# Patient Record
Sex: Female | Born: 1985 | Hispanic: No | Marital: Married | Smoking: Former smoker
Health system: Southern US, Community
[De-identification: ages and names within clinical notes are randomized; demographics above are authoritative.]

## PROBLEM LIST (undated history)

## (undated) DIAGNOSIS — R42 Dizziness and giddiness: Secondary | ICD-10-CM

## (undated) DIAGNOSIS — R102 Pelvic and perineal pain: Secondary | ICD-10-CM

## (undated) DIAGNOSIS — R635 Abnormal weight gain: Secondary | ICD-10-CM

## (undated) DIAGNOSIS — H9209 Otalgia, unspecified ear: Secondary | ICD-10-CM

## (undated) DIAGNOSIS — R109 Unspecified abdominal pain: Secondary | ICD-10-CM

## (undated) HISTORY — DX: Pelvic and perineal pain: R10.2

## (undated) HISTORY — DX: Morbid (severe) obesity due to excess calories: E66.01

## (undated) HISTORY — DX: Abnormal weight gain: R63.5

## (undated) HISTORY — DX: Otalgia, unspecified ear: H92.09

## (undated) HISTORY — PX: KNEE SURGERY: SHX244

## (undated) HISTORY — PX: TONSILLECTOMY AND ADENOIDECTOMY: SHX28

## (undated) HISTORY — DX: Dizziness and giddiness: R42

## (undated) HISTORY — DX: Unspecified abdominal pain: R10.9

---

## 2007-09-15 HISTORY — PX: DILATION AND CURETTAGE OF UTERUS: SHX78

## 2008-09-14 HISTORY — PX: KNEE SURGERY: SHX244

## 2015-06-05 ENCOUNTER — Ambulatory Visit (INDEPENDENT_AMBULATORY_CARE_PROVIDER_SITE_OTHER): Payer: Medicaid Other | Admitting: Family Medicine

## 2015-06-05 ENCOUNTER — Encounter: Payer: Self-pay | Admitting: Family Medicine

## 2015-06-05 VITALS — BP 94/53 | HR 56 | Temp 97.8°F | Ht 62.5 in | Wt 280.4 lb

## 2015-06-05 DIAGNOSIS — H9202 Otalgia, left ear: Secondary | ICD-10-CM | POA: Diagnosis not present

## 2015-06-05 DIAGNOSIS — R109 Unspecified abdominal pain: Secondary | ICD-10-CM | POA: Insufficient documentation

## 2015-06-05 DIAGNOSIS — H9209 Otalgia, unspecified ear: Secondary | ICD-10-CM

## 2015-06-05 DIAGNOSIS — Z008 Encounter for other general examination: Secondary | ICD-10-CM | POA: Diagnosis not present

## 2015-06-05 DIAGNOSIS — R42 Dizziness and giddiness: Secondary | ICD-10-CM

## 2015-06-05 DIAGNOSIS — R635 Abnormal weight gain: Secondary | ICD-10-CM

## 2015-06-05 DIAGNOSIS — Z0289 Encounter for other administrative examinations: Secondary | ICD-10-CM

## 2015-06-05 HISTORY — DX: Unspecified abdominal pain: R10.9

## 2015-06-05 HISTORY — DX: Abnormal weight gain: R63.5

## 2015-06-05 HISTORY — DX: Dizziness and giddiness: R42

## 2015-06-05 HISTORY — DX: Otalgia, unspecified ear: H92.09

## 2015-06-05 MED ORDER — CIPROFLOXACIN-DEXAMETHASONE 0.3-0.1 % OT SUSP: 4.0000 [drp] | Freq: Two times a day (BID) | OTIC | Status: DC

## 2015-06-05 NOTE — Progress Notes (Signed)
Tahani utilized during today's visit.  [facility] Visit  HPI: Patient presents to [facility] today for a new patient appointment to establish general primary care, also to discuss dizziness.   DIZZINESS Feeling dizzy for 3-4 days. Dizziness is when she lies down on the bed  Feels like room spins: yes Lightheadedness when stands: yes Palpitations or heart racing: no Prior dizziness: every 5-6 months she has a middle ear infection and experiences these symptoms  Medications tried: given ABX for ear infection. Reports taking medication for dizziness before but doesn't recall what it is  Taking blood thinners: reported when she was pregnant   Symptoms Hearing Loss: no Ear Pain or fullness: pain in left ear. Her "ear infections" started about 6-7 years ago and has it 2-3 times per year  Nausea or vomiting: no Vision difficulty or double vision: no Falls: no Head trauma: no Weakness in arm or leg: no Speaking problems: no Headache: no  Weight gain:  She had a left knee surgery about 6 years ago.  Developed some leg numbness after the surgery  She was told to take cortisone and referred to PT  She had been taking cortisone everyday until 1 year ago she stopped the medications herself.  Her previous heaviest weight was 65 kg.  She would eat all of the time while she was taking the cortisone.   Abdominal cramping/fertility concerns:  She has lower abdominal pain when she is on her cycle. She would like to be referred to an OB for her fertility concerns.    ROS: otherwise see HPI  Past Medical Hx:  -none   Past Surgical Hx:  -tonsillectomy  - adenectomy  - c section  -  Left knee procedure: vertical scar over patella   Family Hx: updated in Epic - Number of family members:  11 mother/father/brothers/sisters  - Number of family members in [place]:  None. They currently live in [place]   Immigrant Social History: - Name spelling correct?: yes - Date arrived in [place]:  April 12, 2015  - Country of origin: [place]  - Location of refugee camp (if applicable), how long there, and what caused patient to leave home country?: no, the current war  - Primary language: arabic   -Requires intepreter (essentially speaks no [place]) - Education: Highest level of education: [employer] she didn't pass her last test of test  - Prior work: Investment banker, operational, Patent attorney  - Best family contact/phone number [phone] - Tobacco/alcohol/drug use: 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  n/a  - if yes:  Are you having bad dreams about your experience?     Do you feel "jumpy" or "nervous?"     Do you feel that the experience is happening again?     Are you "super alert" or watchful?   Preventative Care History: -Seen at health department?: no  PHYSICAL EXAM: BP 94/53 mmHg  Pulse 56  Temp(Src) 97.8 F (36.6 C) (Oral)  Ht 5' 2.5" (1.588 m)  Wt 280 lb 6.4 oz (127.189 kg)  BMI 50.44 kg/m2  LMP 06/03/2015 Gen: well appearing, NAD  HEENT: TM's clear and intact,some pain with palpation of the tragus and the left side, no erythema in the ear canal, no cervical lymphadenopathy, clear conjunctiva, discoloration noticed on bottom teeth, Neck:  supple Heart: bradycardic regular rhythm, no murmur, rubs or gallops Lungs: clear to auscultation bilaterally, no extra effort, Abdomen: soft, nontender nondistended, positive bowel sounds, no hepatosplenomegaly Skin:  Vertical scar that is healed on her left patella, no rashes MSK: 5 out of 5 strength in upper and lower extremities, Neuro: cranial nerves II-12 intact, normal gait Psych: alert and oriented  Orthostatic vital signs  Lying 110/60 61 Sitting 107/66 63 Standing 106/37 76 taken immediately upon standing Standing 93/56 72 three minutes of standing    Examined and interviewed with Dr. Gwendolyn Grant  Dizziness Reports that her dizziness is associated with ear infections but today exam looks normal. Some pain to palpation of the tragus on the left side Orthostatics positive today. This could be related to her chronic steroid use.  Also possible for vertigo as having some ear pain  - lab testing for cortisol  - ciprodex for ear pain  - pending lab work, will determine treatment plan  - if no improvement may need to consider meclizine vs vestibular PT vs CT of head/neck vs referral to ENT    Weight gain Would suspect some weight gain after having knee surgery  Reports being placed on steroid for several years after surgery  She stopped this herself and has not had f/u. This may be contributing to her dizziness symptoms.  - am cortisol, BMP, TSH, CBC   Ear pain Occurring in left ear  Possible for some otitis externa as pain with palpation on tragus, posterior auricle Membrane is intact with no fluid  Uvula midline with no tonsillar swelling or abscess formation  - ciprodex drops   Abdominal cramping Has cramping related to her cycle and concerns with fertility  Denies any changes in BM  Would prefer a female provider as per her religious customs.  - referral to OB/GYN

## 2015-06-05 NOTE — Assessment & Plan Note (Signed)
Occurring in left ear  Possible for some otitis externa as pain with palpation on tragus, posterior auricle Membrane is intact with no fluid  Uvula midline with no tonsillar swelling or abscess formation  - ciprodex drops

## 2015-06-05 NOTE — Assessment & Plan Note (Addendum)
Would suspect some weight gain after having knee surgery  Reports being placed on steroid for several years after surgery  She stopped this herself and has not had f/u. This may be contributing to her dizziness symptoms.  - am cortisol, BMP, TSH, CBC

## 2015-06-05 NOTE — Assessment & Plan Note (Addendum)
Reports that her dizziness is associated with ear infections but today exam looks normal. Some pain to palpation of the tragus on the left side Orthostatics positive today. This could be related to her chronic steroid use.  Also possible for vertigo as having some ear pain  - lab testing for cortisol  - ciprodex for ear pain  - pending lab work, will determine treatment plan  - if no improvement may need to consider meclizine vs vestibular PT vs CT of head/neck vs referral to ENT

## 2015-06-05 NOTE — Patient Instructions (Signed)
Use the ear drops for the next week.   Come back in the morning for a lab appointment.

## 2015-06-05 NOTE — Assessment & Plan Note (Addendum)
Has cramping related to her cycle and concerns with fertility  Denies any changes in BM  Would prefer a female provider as per her religious customs.  - referral to OB/GYN

## 2015-06-10 ENCOUNTER — Other Ambulatory Visit (INDEPENDENT_AMBULATORY_CARE_PROVIDER_SITE_OTHER): Payer: Medicaid Other

## 2015-06-10 DIAGNOSIS — R42 Dizziness and giddiness: Secondary | ICD-10-CM

## 2015-06-10 DIAGNOSIS — Z0289 Encounter for other administrative examinations: Secondary | ICD-10-CM

## 2015-06-10 LAB — CBC WITH DIFFERENTIAL/PLATELET
Basophils Absolute: 0 10*3/uL (ref 0.0–0.1)
Basophils Relative: 0 % (ref 0–1)
Eosinophils Absolute: 0.1 10*3/uL (ref 0.0–0.7)
Eosinophils Relative: 1 % (ref 0–5)
HEMATOCRIT: 37.3 % (ref 36.0–46.0)
HEMOGLOBIN: 11.9 g/dL — AB (ref 12.0–15.0)
LYMPHS PCT: 30 % (ref 12–46)
Lymphs Abs: 2.6 10*3/uL (ref 0.7–4.0)
MCH: 26.6 pg (ref 26.0–34.0)
MCHC: 31.9 g/dL (ref 30.0–36.0)
MCV: 83.4 fL (ref 78.0–100.0)
MONO ABS: 0.5 10*3/uL (ref 0.1–1.0)
MONOS PCT: 6 % (ref 3–12)
MPV: 10.5 fL (ref 8.6–12.4)
NEUTROS ABS: 5.4 10*3/uL (ref 1.7–7.7)
Neutrophils Relative %: 63 % (ref 43–77)
Platelets: 280 10*3/uL (ref 150–400)
RBC: 4.47 MIL/uL (ref 3.87–5.11)
RDW: 14.7 % (ref 11.5–15.5)
WBC: 8.5 10*3/uL (ref 4.0–10.5)

## 2015-06-10 NOTE — Progress Notes (Signed)
Labs done today marci holder 

## 2015-06-11 ENCOUNTER — Telehealth: Payer: Self-pay | Admitting: Family Medicine

## 2015-06-11 ENCOUNTER — Ambulatory Visit (INDEPENDENT_AMBULATORY_CARE_PROVIDER_SITE_OTHER): Payer: Medicaid Other | Admitting: Family Medicine

## 2015-06-11 ENCOUNTER — Encounter: Payer: Self-pay | Admitting: Family Medicine

## 2015-06-11 VITALS — BP 119/67 | HR 64 | Temp 97.5°F | Wt 280.0 lb

## 2015-06-11 DIAGNOSIS — E2740 Unspecified adrenocortical insufficiency: Secondary | ICD-10-CM | POA: Diagnosis not present

## 2015-06-11 DIAGNOSIS — R42 Dizziness and giddiness: Secondary | ICD-10-CM

## 2015-06-11 DIAGNOSIS — T380X5A Adverse effect of glucocorticoids and synthetic analogues, initial encounter: Secondary | ICD-10-CM

## 2015-06-11 LAB — COMPREHENSIVE METABOLIC PANEL
ALK PHOS: 45 U/L (ref 33–115)
ALT: 18 U/L (ref 6–29)
AST: 14 U/L (ref 10–30)
Albumin: 4.1 g/dL (ref 3.6–5.1)
BUN: 7 mg/dL (ref 7–25)
CHLORIDE: 101 mmol/L (ref 98–110)
CO2: 30 mmol/L (ref 20–31)
Calcium: 9.4 mg/dL (ref 8.6–10.2)
Creat: 0.54 mg/dL (ref 0.50–1.10)
GLUCOSE: 90 mg/dL (ref 65–99)
POTASSIUM: 4.2 mmol/L (ref 3.5–5.3)
Sodium: 139 mmol/L (ref 135–146)
Total Bilirubin: 0.4 mg/dL (ref 0.2–1.2)
Total Protein: 7.5 g/dL (ref 6.1–8.1)

## 2015-06-11 LAB — TSH: TSH: 1.34 u[IU]/mL (ref 0.350–4.500)

## 2015-06-11 LAB — HIV ANTIBODY (ROUTINE TESTING W REFLEX): HIV 1&2 Ab, 4th Generation: NONREACTIVE

## 2015-06-11 LAB — CORTISOL-AM, BLOOD: CORTISOL - AM: 5.9 ug/dL (ref 4.3–22.4)

## 2015-06-11 LAB — RPR

## 2015-06-11 MED ORDER — MECLIZINE HCL 25 MG PO TABS: 12.5000 mg | ORAL_TABLET | Freq: Three times a day (TID) | ORAL | Status: DC | PRN

## 2015-06-11 NOTE — Progress Notes (Signed)
Subjective: CC: dizzy/ off balance HPI: Patient is a 29 y.o. female presenting to clinic today for same day appointment. Concerns today include:  Arabic interpretation provided by [facility] Interpreter ID [phone]  Dizziness/ weakness Patient reports that she was seen last week for dizziness. She was seen in clinic last week and was given ear drops to help with her ears.  She reports that she took oral steroids for several years and stopped them suddenly 1-1.5 years ago.  She reports that dizziness, weakness, off balance symptoms developed about 1-2 weeks ago.  She reports that she has had this before, occuring every 4-5 months.  She reports that episodes last 1 week when she is on medication in [place].  She believes it may be an antibiotic.  She reports that dizziness is present when she is laying down flat.  She also notes the rooms spinning and feeling light headedness when she is active doing chores.  Denies fevers, chills, nausea, rhinorrhea, congestion, vision changes.  Endorses occasional b/l inner ear pain.     Social History Reviewed: non smoker. FamHx and MedHx reviewed.  Please see EMR. Health Maintenance: Flu shot and pap smear due  ROS: All other systems reviewed and are negative.  Objective: Office vital signs reviewed. BP 119/67 mmHg  Pulse 64  Temp(Src) 97.5 F (36.4 C) (Oral)  Wt 280 lb (127.007 kg)  LMP 06/03/2015  Physical Examination:  General: Awake, alert, obese, NAD HEENT: Normal    Neck: No masses palpated. No LAD    Ears: TMs intact, normal light reflex, no erythema, no bulging    Eyes: PERRLA, EOMI    Nose: nasal turbinates moist    Throat: MMM, no erythema Cardio: RRR, S1S2 heard, no murmurs appreciated Pulm: CTAB, no wheezes, rhonchi or rales, normal WOB Extremities: WWP, No edema, cyanosis or clubbing; +2 pulses bilaterally MSK: Normal gait and station, ambulates independently Skin: dry, intact, no rashes or lesions Neuro: Strength and sensation  grossly intact, no focal neurologic deficits, speech normal, follows commands, CN 2-12 grossly in tact  Results for orders placed or performed in visit on 06/10/15 (from the past 72 hour(s))  Cortisol-am, blood     Status: None   Collection Time: 06/10/15  8:56 AM  Result Value Ref Range   Cortisol - AM 5.9 4.3 - 22.4 ug/dL  Comprehensive metabolic panel     Status: None   Collection Time: 06/10/15  8:56 AM  Result Value Ref Range   Sodium 139 135 - 146 mmol/L   Potassium 4.2 3.5 - 5.3 mmol/L   Chloride 101 98 - 110 mmol/L   CO2 30 20 - 31 mmol/L   Glucose, Bld 90 65 - 99 mg/dL   BUN 7 7 - 25 mg/dL   Creat 0.54 0.50 - 1.10 mg/dL   Total Bilirubin 0.4 0.2 - 1.2 mg/dL   Alkaline Phosphatase 45 33 - 115 U/L   AST 14 10 - 30 U/L   ALT 18 6 - 29 U/L   Total Protein 7.5 6.1 - 8.1 g/dL   Albumin 4.1 3.6 - 5.1 g/dL   Calcium 9.4 8.6 - 10.2 mg/dL  CBC with Differential/Platelet     Status: Abnormal   Collection Time: 06/10/15  8:56 AM  Result Value Ref Range   WBC 8.5 4.0 - 10.5 K/uL   RBC 4.47 3.87 - 5.11 MIL/uL   Hemoglobin 11.9 (L) 12.0 - 15.0 g/dL   HCT 37.3 36.0 - 46.0 %   MCV 83.4  78.0 - 100.0 fL   MCH 26.6 26.0 - 34.0 pg   MCHC 31.9 30.0 - 36.0 g/dL   RDW 14.7 11.5 - 15.5 %   Platelets 280 150 - 400 K/uL   MPV 10.5 8.6 - 12.4 fL   Neutrophils Relative % 63 43 - 77 %   Neutro Abs 5.4 1.7 - 7.7 K/uL   Lymphocytes Relative 30 12 - 46 %   Lymphs Abs 2.6 0.7 - 4.0 K/uL   Monocytes Relative 6 3 - 12 %   Monocytes Absolute 0.5 0.1 - 1.0 K/uL   Eosinophils Relative 1 0 - 5 %   Eosinophils Absolute 0.1 0.0 - 0.7 K/uL   Basophils Relative 0 0 - 1 %   Basophils Absolute 0.0 0.0 - 0.1 K/uL   Smear Review Criteria for review not met   TSH     Status: None   Collection Time: 06/10/15  8:56 AM  Result Value Ref Range   TSH 1.340 0.350 - 4.500 uIU/mL  HIV antibody     Status: None   Collection Time: 06/10/15  8:56 AM  Result Value Ref Range   HIV 1&2 Ab, 4th Generation  NONREACTIVE NONREACTIVE    Comment:   HIV-1 antigen and HIV-1/HIV-2 antibodies were not detected.  There is no laboratory evidence of HIV infection.   HIV-1/2 Antibody Diff        Not indicated. HIV-1 RNA, Qual TMA          Not indicated.     PLEASE NOTE: This information has been disclosed to you from records whose confidentiality may be protected by state law. If your state requires such protection, then the state law prohibits you from making any further disclosure of the information without the specific written consent of the person to whom it pertains, or as otherwise permitted by law. A general authorization for the release of medical or other information is NOT sufficient for this purpose.   The performance of this assay has not been clinically validated in patients less than 7 years old.   For additional information please refer to http://education.questdiagnostics.com/faq/FAQ106.  (This link is being provided for informational/educational purposes only.)     RPR     Status: None   Collection Time: 06/10/15  8:56 AM  Result Value Ref Range   RPR Ser Ql NON REAC NON REAC   Assessment/ Plan: 28 y.o. 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  AM Cortisol 5.9 (relatively low normal) - Ambulatory referral to Endocrinology  2. Dizziness. No focal neurologic deficits.  Suspect likely related to cortisol level.  However patient w/ long h/o inner ear problems per report.  Therefore, cannot r/o inner ear pathology.  ENT exam grossly normal. - Ambulatory referral to Endocrinology - Ambulatory referral to ENT - meclizine (ANTIVERT) 25 MG tablet; Take 0.5-1 tablets (12.5-25 mg total) by mouth 3 (three) times daily as needed for dizziness.  Dispense: 30 tablet; Refill: 0 - Return precautions reviewed with patient - Follow up in 1 month or sooner if needed  Total time spent with patient 30 minutes.  Greater than 50% of  encounter spent in coordination of care/counseling.  Janora Norlander, DO PGY-2, [facility]

## 2015-06-11 NOTE — Telephone Encounter (Signed)
Hlat Lo from the health dept called and would like a copy of the patient office visit and also the labs from 06/05/15 faxed to her at [phone]. jw

## 2015-06-11 NOTE — Patient Instructions (Addendum)
It was a pleasure seeing you today.  Information regarding what we discussed is included in this packet.  Please make an appointment to see your PCP in 1 month.  I have placed a referral to an endocrinologist for you to evaluate your cortisol levels further.  I have also placed a referral to an Ear Nose and Throat doctor to further evaluate your inner ear.  Please feel free to call our office at [phone] if any questions or concerns arise.  Warm Regards, Ashly M. Gottschalk, DO Vertigo Vertigo means you feel like you or your surroundings are moving when they are not. Vertigo can be dangerous if it occurs when you are at work, driving, or performing difficult activities.  CAUSES  Vertigo occurs when there is a conflict of signals sent to your brain from the visual and sensory systems in your body. There are many different causes of vertigo, including:  Infections, especially in the inner ear.  A bad reaction to a drug or misuse of alcohol and medicines.  Withdrawal from drugs or alcohol.  Rapidly changing positions, such as lying down or rolling over in bed.  A migraine headache.  Decreased blood flow to the brain.  Increased pressure in the brain from a head injury, infection, tumor, or bleeding. SYMPTOMS  You may feel as though the world is spinning around or you are falling to the ground. Because your balance is upset, vertigo can cause nausea and vomiting. You may have involuntary eye movements (nystagmus). DIAGNOSIS  Vertigo is usually diagnosed by physical exam. If the cause of your vertigo is unknown, your caregiver may perform imaging tests, such as an MRI scan (magnetic resonance imaging). TREATMENT  Most cases of vertigo resolve on their own, without treatment. Depending on the cause, your caregiver may prescribe certain medicines. If your vertigo is related to body position issues, your caregiver may recommend movements or procedures to correct the problem. In rare cases,  if your vertigo is caused by certain inner ear problems, you may need surgery. HOME CARE INSTRUCTIONS   Follow your caregiver's instructions.  Avoid driving.  Avoid operating heavy machinery.  Avoid performing any tasks that would be dangerous to you or others during a vertigo episode.  Tell your caregiver if you notice that certain medicines seem to be causing your vertigo. Some of the medicines used to treat vertigo episodes can actually make them worse in some people. SEEK IMMEDIATE MEDICAL CARE IF:   Your medicines do not relieve your vertigo or are making it worse.  You develop problems with talking, walking, weakness, or using your arms, hands, or legs.  You develop severe headaches.  Your nausea or vomiting continues or gets worse.  You develop visual changes.  A family member notices behavioral changes.  Your condition gets worse. MAKE SURE YOU:  Understand these instructions.  Will watch your condition.  Will get help right away if you are not doing well or get worse. Document Released: 06/10/2005 Document Revised: 11/23/2011 Document Reviewed: 03/19/2011 [facility] Patient Information 2015 [place], [place]. This information is not intended to replace advice given to you by your health care Vincenzo Stave. Make sure you discuss any questions you have with your health care Shilah Hefel.

## 2015-06-17 ENCOUNTER — Telehealth: Payer: Self-pay | Admitting: Family Medicine

## 2015-06-17 NOTE — Telephone Encounter (Signed)
Dr. Daune Perch office called because they are trying to set an appointment with the patient but no one in the household speaks english. She would like to speak to Tia. jw

## 2015-06-24 ENCOUNTER — Other Ambulatory Visit: Payer: Self-pay | Admitting: Family Medicine

## 2015-06-24 DIAGNOSIS — Z3169 Encounter for other general counseling and advice on procreation: Secondary | ICD-10-CM

## 2015-06-24 NOTE — Telephone Encounter (Signed)
Printed and faxed as requested. Darlene Cruz  

## 2015-07-18 ENCOUNTER — Ambulatory Visit: Payer: Medicaid Other | Admitting: Family Medicine

## 2016-03-05 ENCOUNTER — Ambulatory Visit: Payer: Medicaid Other | Admitting: Family Medicine

## 2016-03-09 ENCOUNTER — Ambulatory Visit (INDEPENDENT_AMBULATORY_CARE_PROVIDER_SITE_OTHER): Payer: Medicaid Other | Admitting: Family Medicine

## 2016-03-09 VITALS — BP 121/66 | HR 65 | Ht 62.5 in | Wt 287.8 lb

## 2016-03-09 DIAGNOSIS — R102 Pelvic and perineal pain: Secondary | ICD-10-CM

## 2016-03-09 DIAGNOSIS — R350 Frequency of micturition: Secondary | ICD-10-CM

## 2016-03-09 LAB — POCT URINALYSIS DIPSTICK
Bilirubin, UA: NEGATIVE
GLUCOSE UA: NEGATIVE
Nitrite, UA: NEGATIVE
PROTEIN UA: NEGATIVE
Urobilinogen, UA: 0.2
pH, UA: 5.5

## 2016-03-09 LAB — POCT URINE PREGNANCY: PREG TEST UR: NEGATIVE

## 2016-03-09 NOTE — Patient Instructions (Signed)
Thank you for coming in,   I will call or send a letter with the results from today.    Please bring all of your medications with you to each visit.   Health maintenance items that are due.  Health Maintenance  Topic Date Due  . Tetanus Vaccine  02/12/2005  . Pap Smear  02/13/2007  . Flu Shot  04/14/2016  . HIV Screening  Completed     Sign up for My Chart to have easy access to your labs results, and communication with your Primary care physician   Please feel free to call with any questions or concerns at any time, at [phone]. --Dr. Jordan LikesSchmitz

## 2016-03-09 NOTE — Progress Notes (Signed)
   Subjective:    Darlene Cruz - 30 y.o. female MRN [id number]  Date of birth: 06/16/1986  CC pelvic pain   HPI  Darlene Cruz is here for pelvic pain. Visit was conducted with arabic video interpretor.   She is having pain in the left side of her pelvis.  It has been occurring for the past two months.  It is associated with her menstrual cycle  The pain lasts for an hour  It is painful to sit up.  Walking is hard and has had an increase in the frequency in voiding.  There are no changes in her bowel moments.  Home pregnancy test was negative.  She had similarly symptoms three years ago.  She had a history of c section  Ibuprofen hasn't help  Tried a home remedy tea that didn't help  Pain is severe and 10/10 .  PMH: morbid obesity SH: no tobacco or alcohol  FH: mother with HLD, HTN, DM2  Health Maintenance:  Health Maintenance Due  Topic Date Due  . TETANUS/TDAP  02/12/2005  . PAP SMEAR  02/13/2007    Review of Systems See HPI     Objective:   Physical Exam BP 121/66 mmHg  Pulse 65  Ht 5' 2.5" (1.588 m)  Wt 287 lb 12.8 oz (130.545 kg)  BMI 51.77 kg/m2  LMP 03/01/2016 Gen: NAD, alert, cooperative with exam,  CV: RRR, good S1/S2, no murmur, no edema, capillary refill brisk  Resp: CTABL, no wheezes, non-labored Abd: Soft, ND, BS present, no guarding or organomegaly, no deformity, mild pain over the left ovary  Skin: no rashes, normal turgor      Assessment & Plan:   Pelvic pain in female She has had pelvic pain that could be related to her menstrual cycle vs MSK pain vs ovarian cysts.  She has had a referral to Gyn but they recommended she use NSAIDS and obtain a pelvic and transvaginal US.  - encouraged NSAiDS for pain  - pelvic and transvaginal US placed  - urine pregnancy normal  - UA not suggestive of infection

## 2016-03-11 ENCOUNTER — Encounter: Payer: Self-pay | Admitting: Family Medicine

## 2016-03-12 DIAGNOSIS — R102 Pelvic and perineal pain: Secondary | ICD-10-CM

## 2016-03-12 HISTORY — DX: Pelvic and perineal pain: R10.2

## 2016-03-12 NOTE — Assessment & Plan Note (Signed)
She has had pelvic pain that could be related to her menstrual cycle vs MSK pain vs ovarian cysts.  She has had a referral to Gyn but they recommended she use NSAIDS and obtain a pelvic and transvaginal US.  - encouraged NSAiDS for pain  - pelvic and transvaginal US placed  - urine pregnancy normal  - UA not suggestive of infection

## 2016-03-13 ENCOUNTER — Ambulatory Visit (HOSPITAL_COMMUNITY)
Admission: RE | Admit: 2016-03-13 | Discharge: 2016-03-13 | Disposition: A | Discharge status: Home / Self Care | Payer: Medicaid Other | Source: Ambulatory Visit | Attending: Family Medicine | Admitting: Family Medicine

## 2016-03-13 DIAGNOSIS — R102 Pelvic and perineal pain: Secondary | ICD-10-CM | POA: Diagnosis not present

## 2016-03-18 ENCOUNTER — Telehealth: Payer: Self-pay | Admitting: Family Medicine

## 2016-03-18 DIAGNOSIS — R102 Pelvic and perineal pain: Secondary | ICD-10-CM

## 2016-03-18 NOTE — Telephone Encounter (Addendum)
Pt informed by using pacific interpreters ([place], [phone]). Pt asked if she can get a referral to a GYN.  Please advise. Sunday SpillersSharon T Saunders, CMA

## 2016-03-18 NOTE — Telephone Encounter (Signed)
US is normal. Patient may need referral to Gyn  Myra RudeJeremy E Schmitz, MD PGY-3, [facility] 03/18/2016, 2:36 PM

## 2016-03-19 NOTE — Addendum Note (Signed)
Addended by: Beaulah DinningGAMBINO, Meka Lewan M on: 03/19/2016 05:22 PM   Modules accepted: Orders

## 2016-03-19 NOTE — Telephone Encounter (Signed)
Referral to gyn placed. 

## 2016-04-20 ENCOUNTER — Encounter: Payer: Medicaid Other | Admitting: Obstetrics & Gynecology

## 2016-11-02 ENCOUNTER — Encounter (HOSPITAL_COMMUNITY): Payer: Self-pay | Admitting: Emergency Medicine

## 2016-11-02 ENCOUNTER — Ambulatory Visit (HOSPITAL_COMMUNITY)
Admission: EM | Admit: 2016-11-02 | Discharge: 2016-11-02 | Disposition: A | Discharge status: Home / Self Care | Payer: Medicaid Other | Attending: Internal Medicine | Admitting: Internal Medicine

## 2016-11-02 DIAGNOSIS — L308 Other specified dermatitis: Secondary | ICD-10-CM | POA: Diagnosis not present

## 2016-11-02 DIAGNOSIS — N946 Dysmenorrhea, unspecified: Secondary | ICD-10-CM

## 2016-11-02 LAB — POCT URINALYSIS DIP (DEVICE)
Bilirubin Urine: NEGATIVE
GLUCOSE, UA: NEGATIVE mg/dL
Ketones, ur: NEGATIVE mg/dL
LEUKOCYTES UA: NEGATIVE
NITRITE: NEGATIVE
PROTEIN: NEGATIVE mg/dL
Specific Gravity, Urine: 1.01 (ref 1.005–1.030)
UROBILINOGEN UA: 0.2 mg/dL (ref 0.0–1.0)
pH: 5.5 (ref 5.0–8.0)

## 2016-11-02 LAB — POCT PREGNANCY, URINE: Preg Test, Ur: NEGATIVE

## 2016-11-02 MED ORDER — IBUPROFEN 800 MG PO TABS: ORAL_TABLET | ORAL | 1 refills | Status: DC

## 2016-11-02 MED ORDER — FLUTICASONE PROPIONATE 0.005 % EX OINT: 1.0000 "application " | TOPICAL_OINTMENT | Freq: Two times a day (BID) | CUTANEOUS | 1 refills | Status: DC

## 2016-11-02 MED ORDER — KETOROLAC TROMETHAMINE 60 MG/2ML IM SOLN
INTRAMUSCULAR | Status: AC
  Filled 2016-11-02: qty 2

## 2016-11-02 MED ORDER — KETOROLAC TROMETHAMINE 60 MG/2ML IM SOLN
60.0000 mg | Freq: Once | INTRAMUSCULAR | Status: AC
  Administered 2016-11-02: 60 mg via INTRAMUSCULAR

## 2016-11-02 NOTE — ED Triage Notes (Signed)
[facility] a [facility] abdominal and low back pain x 2 days.

## 2016-11-02 NOTE — ED Provider Notes (Signed)
CSN: [id number]     Arrival date & time 11/02/16  1625 History   First MD Initiated Contact with Patient 11/02/16 1810     Chief Complaint  Patient presents with  . Abdominal Pain  . Back Pain   (Consider location/radiation/quality/duration/timing/severity/associated sxs/prior Treatment) Patient is c/o dysmenorrhea for 2 days.  She has an interpreter.  She c/o very painful menses and back pain.  She c/o rash on right finger webbing.   The history is provided by the patient and the spouse. The history is limited by a language barrier. A language interpreter was used.  Abdominal Pain  Pain location:  Generalized Pain quality: aching   Pain radiates to:  Back Pain severity:  Moderate Onset quality:  Sudden Duration:  2 days Timing:  Constant Progression:  Worsening Chronicity:  New Relieved by:  Nothing Worsened by:  Nothing Ineffective treatments:  None tried Back Pain  Associated symptoms: abdominal pain     History reviewed. No pertinent past medical history. Past Surgical History:  Procedure Laterality Date  . CESAREAN SECTION    . TONSILLECTOMY AND ADENOIDECTOMY     Family History  Problem Relation Age of Onset  . Hypertension Mother   . Diabetes Mother   . Hyperlipidemia Mother    Social History  Substance Use Topics  . Smoking status: Former Smoker    Packs/day: 0.20    Years: 5.00    Types: Cigarettes  . Smokeless tobacco: Not on file     Comment: quit July 2016   . Alcohol use No   OB History    No data available     Review of Systems  Constitutional: Negative.   HENT: Negative.   Eyes: Negative.   Respiratory: Negative.   Cardiovascular: Negative.   Gastrointestinal: Positive for abdominal pain.  Endocrine: Negative.   Genitourinary: Negative.   Musculoskeletal: Positive for back pain.  Allergic/Immunologic: Negative.   Neurological: Negative.   Hematological: Negative.   Psychiatric/Behavioral: Negative.     Allergies  Patient has no  known allergies.  Home Medications   Prior to Admission medications   Medication Sig Start Date End Date Taking? Authorizing Provider  fluticasone (CUTIVATE) 0.005 % ointment Apply 1 application topically 2 (two) times daily. 11/02/16   Deatra Canter, FNP  ibuprofen (ADVIL,MOTRIN) 800 MG tablet Take one po tid x 5 days and start 2 days prior to menses. 11/02/16   Deatra Canter, FNP   Meds Ordered and Administered this Visit   Medications  ketorolac (TORADOL) injection 60 mg (60 mg Intramuscular Given 11/02/16 1844)    BP 106/56 (BP Location: Right Arm)   Pulse (!) 59   Temp 98.2 F (36.8 C) (Oral)   Resp 16   LMP 11/01/2016 (Exact Date)   SpO2 99%  No data found.   Physical Exam  Constitutional: She appears well-developed and well-nourished.  HENT:  Head: Normocephalic and atraumatic.  Eyes: Conjunctivae and EOM are normal. Pupils are equal, round, and reactive to light.  Neck: Normal range of motion. Neck supple.  Cardiovascular: Normal rate, regular rhythm and normal heart sounds.   Pulmonary/Chest: Effort normal and breath sounds normal.  Nursing note and vitals reviewed.   Urgent Care Course     Procedures (including critical care time)  Labs Review Labs Reviewed  POCT URINALYSIS DIP (DEVICE) - Abnormal; Notable for the following:       Result Value   Hgb urine dipstick SMALL (*)    All other components within  normal limits  POCT PREGNANCY, URINE    Imaging Review No results found.   Visual Acuity Review  Right Eye Distance:   Left Eye Distance:   Bilateral Distance:    Right Eye Near:   Left Eye Near:    Bilateral Near:         MDM   1. Dysmenorrhea   2. Other eczema    Ibuprofen 800mg  one po tid x 5 days prn painful menses #30w/1rf Cutivate Cream bid prn rash #30grams  [facility] for [facility].      Deatra CanterWilliam J Amias Hutchinson, FNP 11/02/16 1914

## 2016-11-19 ENCOUNTER — Emergency Department (HOSPITAL_COMMUNITY)
Admission: EM | Admit: 2016-11-19 | Discharge: 2016-11-19 | Disposition: A | Discharge status: Home / Self Care | Payer: Self-pay | Attending: Emergency Medicine | Admitting: Emergency Medicine

## 2016-11-19 ENCOUNTER — Emergency Department (HOSPITAL_COMMUNITY): Payer: Self-pay

## 2016-11-19 ENCOUNTER — Encounter (HOSPITAL_COMMUNITY): Payer: Self-pay

## 2016-11-19 DIAGNOSIS — Z79899 Other long term (current) drug therapy: Secondary | ICD-10-CM | POA: Insufficient documentation

## 2016-11-19 DIAGNOSIS — R109 Unspecified abdominal pain: Secondary | ICD-10-CM

## 2016-11-19 DIAGNOSIS — R1011 Right upper quadrant pain: Secondary | ICD-10-CM | POA: Insufficient documentation

## 2016-11-19 DIAGNOSIS — Z87891 Personal history of nicotine dependence: Secondary | ICD-10-CM | POA: Insufficient documentation

## 2016-11-19 LAB — COMPREHENSIVE METABOLIC PANEL
ALT: 25 U/L (ref 14–54)
ANION GAP: 6 (ref 5–15)
AST: 25 U/L (ref 15–41)
Albumin: 4.4 g/dL (ref 3.5–5.0)
Alkaline Phosphatase: 40 U/L (ref 38–126)
BUN: 9 mg/dL (ref 6–20)
CHLORIDE: 106 mmol/L (ref 101–111)
CO2: 25 mmol/L (ref 22–32)
Calcium: 9.4 mg/dL (ref 8.9–10.3)
Creatinine, Ser: 0.52 mg/dL (ref 0.44–1.00)
Glucose, Bld: 79 mg/dL (ref 65–99)
POTASSIUM: 3.7 mmol/L (ref 3.5–5.1)
SODIUM: 137 mmol/L (ref 135–145)
Total Bilirubin: 0.6 mg/dL (ref 0.3–1.2)
Total Protein: 7.9 g/dL (ref 6.5–8.1)

## 2016-11-19 LAB — URINALYSIS, ROUTINE W REFLEX MICROSCOPIC
BILIRUBIN URINE: NEGATIVE
Glucose, UA: NEGATIVE mg/dL
KETONES UR: 5 mg/dL — AB
LEUKOCYTES UA: NEGATIVE
Nitrite: NEGATIVE
PH: 5 (ref 5.0–8.0)
PROTEIN: NEGATIVE mg/dL
Specific Gravity, Urine: 1.011 (ref 1.005–1.030)

## 2016-11-19 LAB — CBC
HEMATOCRIT: 36.4 % (ref 36.0–46.0)
HEMOGLOBIN: 11.8 g/dL — AB (ref 12.0–15.0)
MCH: 26.6 pg (ref 26.0–34.0)
MCHC: 32.4 g/dL (ref 30.0–36.0)
MCV: 82.2 fL (ref 78.0–100.0)
PLATELETS: 247 10*3/uL (ref 150–400)
RBC: 4.43 MIL/uL (ref 3.87–5.11)
RDW: 14.9 % (ref 11.5–15.5)
WBC: 7.5 10*3/uL (ref 4.0–10.5)

## 2016-11-19 LAB — PREGNANCY, URINE: Preg Test, Ur: NEGATIVE

## 2016-11-19 LAB — LIPASE, BLOOD: LIPASE: 19 U/L (ref 11–51)

## 2016-11-19 MED ORDER — SUCRALFATE 1 GM/10ML PO SUSP: 1.0000 g | Freq: Three times a day (TID) | ORAL | 0 refills | Status: DC

## 2016-11-19 MED ORDER — MORPHINE SULFATE (PF) 4 MG/ML IV SOLN
4.0000 mg | Freq: Once | INTRAVENOUS | Status: AC
  Administered 2016-11-19: 4 mg via INTRAVENOUS
  Filled 2016-11-19: qty 1

## 2016-11-19 MED ORDER — SODIUM CHLORIDE 0.9 % IV BOLUS (SEPSIS)
1000.0000 mL | Freq: Once | INTRAVENOUS | Status: AC
  Administered 2016-11-19: 1000 mL via INTRAVENOUS

## 2016-11-19 MED ORDER — ONDANSETRON HCL 4 MG/2ML IJ SOLN
4.0000 mg | Freq: Once | INTRAMUSCULAR | Status: AC
  Administered 2016-11-19: 4 mg via INTRAVENOUS
  Filled 2016-11-19: qty 2

## 2016-11-19 MED ORDER — ALUM & MAG HYDROXIDE-SIMETH 400-400-40 MG/5ML PO SUSP: 15.0000 mL | ORAL | 0 refills | Status: DC | PRN

## 2016-11-19 MED ORDER — FAMOTIDINE 40 MG PO TABS: 40.0000 mg | ORAL_TABLET | Freq: Every day | ORAL | 0 refills | Status: DC

## 2016-11-19 MED ORDER — IOPAMIDOL (ISOVUE-300) INJECTION 61%
INTRAVENOUS | Status: AC
  Filled 2016-11-19: qty 30

## 2016-11-19 MED ORDER — IOPAMIDOL (ISOVUE-300) INJECTION 61%
30.0000 mL | Freq: Once | INTRAVENOUS | Status: AC | PRN
  Administered 2016-11-19: 30 mL via ORAL

## 2016-11-19 NOTE — ED Notes (Signed)
Called Lab, urine preg is being run at this time.

## 2016-11-19 NOTE — ED Provider Notes (Signed)
[facility]-EMERGENCY DEPT Provider Note   CSN: [id number] Arrival date & time: 11/19/16  1011     History   Chief Complaint Chief Complaint  Patient presents with  . Flank Pain  . Abdominal Pain    HPI Darlene Cruz is a 31 y.o. female.  The history is provided by the patient. A language interpreter was used ([place]).  Abdominal Pain   This is a new problem. Episode onset: 4 days. The problem occurs constantly. The problem has not changed since onset.The pain is located in the RUQ. The pain is moderate. Pertinent negatives include fever and diarrhea. The symptoms are aggravated by certain positions. Nothing relieves the symptoms.   Beliefs it's her Gallbladder.   History reviewed. No pertinent past medical history.  Patient Active Problem List   Diagnosis Date Noted  . Pelvic pain in female 03/12/2016  . Dizziness 06/05/2015  . Weight gain 06/05/2015  . Ear pain 06/05/2015  . Abdominal cramping 06/05/2015    Past Surgical History:  Procedure Laterality Date  . CESAREAN SECTION    . TONSILLECTOMY AND ADENOIDECTOMY      OB History    No data available       Home Medications    Prior to Admission medications   Medication Sig Start Date End Date Taking? Authorizing Provider  ibuprofen (ADVIL,MOTRIN) 800 MG tablet Take one po tid x 5 days and start 2 days prior to menses. Patient taking differently: Take 800 mg by mouth 3 (three) times daily as needed for mild pain or moderate pain.  11/02/16  Yes Deatra CanterWilliam J Oxford, FNP  alum & mag hydroxide-simeth (MAALOX MAX) 400-400-40 MG/5ML suspension Take 15 mLs by mouth as needed for indigestion. 11/19/16   Nira ConnPedro Eduardo Almeter Westhoff, MD  famotidine (PEPCID) 40 MG tablet Take 1 tablet (40 mg total) by mouth daily. 11/19/16 12/19/16  Nira ConnPedro Eduardo Verlin Duke, MD  sucralfate (CARAFATE) 1 GM/10ML suspension Take 10 mLs (1 g total) by mouth 4 (four) times daily -  with meals and at bedtime. 11/19/16   Nira ConnPedro Eduardo Cameryn Schum, MD    Family History Family  History  Problem Relation Age of Onset  . Hypertension Mother   . Diabetes Mother   . Hyperlipidemia Mother     Social History Social History  Substance Use Topics  . Smoking status: Former Smoker    Packs/day: 0.20    Years: 5.00    Types: Cigarettes  . Smokeless tobacco: Never Used     Comment: quit July 2016   . Alcohol use No     Allergies   Patient has no known allergies.   Review of Systems Review of Systems  Constitutional: Negative for fever.  Gastrointestinal: Positive for abdominal pain. Negative for diarrhea.  Ten systems are reviewed and are negative for acute change except as noted in the HPI    Physical Exam Updated Vital Signs BP 100/55 (BP Location: Right Arm)   Pulse (!) 57   Temp 98.1 F (36.7 C) (Oral)   Resp 18   Ht 5' 3.78" (1.62 m)   Wt 245 lb (111.1 kg)   LMP 11/01/2016 (Exact Date)   SpO2 100%   BMI 42.34 kg/m   Physical Exam  Constitutional: She is oriented to person, place, and time. She appears well-developed and well-nourished. No distress.  HENT:  Head: Normocephalic and atraumatic.  Nose: Nose normal.  Eyes: Conjunctivae and EOM are normal. Pupils are equal, round, and reactive to light. Right eye exhibits no discharge. Left eye  exhibits no discharge. No scleral icterus.  Neck: Normal range of motion. Neck supple.  Cardiovascular: Normal rate and regular rhythm.  Exam reveals no gallop and no friction rub.   No murmur heard. Pulmonary/Chest: Effort normal and breath sounds normal. No stridor. No respiratory distress. She has no rales.  Abdominal: Soft. She exhibits no distension. There is tenderness in the right upper quadrant. There is positive Murphy's sign. There is no rigidity, no rebound and no guarding.  Musculoskeletal: She exhibits no edema or tenderness.  Neurological: She is alert and oriented to person, place, and time.  Skin: Skin is warm and dry. No rash noted. She is not diaphoretic. No erythema.  Psychiatric: She  has a normal mood and affect.  Vitals reviewed.    ED Treatments / Results  Labs (all labs ordered are listed, but only abnormal results are displayed) Labs Reviewed  CBC - Abnormal; Notable for the following:       Result Value   Hemoglobin 11.8 (*)    All other components within normal limits  URINALYSIS, ROUTINE W REFLEX MICROSCOPIC - Abnormal; Notable for the following:    Hgb urine dipstick SMALL (*)    Ketones, ur 5 (*)    Bacteria, UA FEW (*)    Squamous Epithelial / LPF 0-5 (*)    All other components within normal limits  URINE CULTURE  LIPASE, BLOOD  COMPREHENSIVE METABOLIC PANEL  PREGNANCY, URINE    EKG  EKG Interpretation None       Radiology Ct Abdomen Pelvis Wo Contrast  Result Date: 11/19/2016 CLINICAL DATA:  31 year old female with right mid abdominal and right flank pain for the past 4 days EXAM: CT ABDOMEN AND PELVIS WITHOUT CONTRAST TECHNIQUE: Multidetector CT imaging of the abdomen and pelvis was performed following the standard protocol without IV contrast. COMPARISON:  Abdominal ultrasound obtained earlier today FINDINGS: Lower chest: No acute abnormality. Hepatobiliary: No focal liver abnormality is seen. No gallstones, gallbladder wall thickening, or biliary dilatation. Pancreas: Unremarkable. No pancreatic ductal dilatation or surrounding inflammatory changes. Spleen: Normal in size without focal abnormality. Adrenals/Urinary Tract: Adrenal glands are unremarkable. Kidneys are normal, without renal calculi, focal lesion, or hydronephrosis. Bladder is unremarkable. Stomach/Bowel: No evidence of obstruction or focal bowel wall thickening. Normal appendix in the right lower quadrant. The terminal ileum is unremarkable. Vascular/Lymphatic: No significant vascular findings are present. No enlarged abdominal or pelvic lymph nodes. Reproductive: Uterus and bilateral adnexa are unremarkable. Incidental note is made of a 2.6 cm water attenuation cyst within the right  ovary consistent with a follicular cyst. This is not an abnormal finding in a reproductive age female. Other: No abdominal wall hernia or abnormality. No abdominopelvic ascites. Musculoskeletal: No acute or significant osseous findings. IMPRESSION: Unremarkable CT scan of the abdomen and pelvis with no acute findings to explain the patient's clinical symptoms. Electronically Signed   By: Malachy Moan M.D.   On: 11/19/2016 17:01   US Abdomen Limited Ruq  Result Date: 11/19/2016 CLINICAL DATA:  Four day history of right upper quadrant pain. EXAM: US ABDOMEN LIMITED - RIGHT UPPER QUADRANT COMPARISON:  None. FINDINGS: Gallbladder: No gallstones or gallbladder wall thickening. No pericholecystic fluid. The sonographer reports no sonographic Murphy's sign. Common bile duct: Diameter: 4 mm Liver: No focal lesion identified. Within normal limits in parenchymal echogenicity. IMPRESSION: Unremarkable limited right upper quadrant ultrasound. Electronically Signed   By: Kennith Center M.D.   On: 11/19/2016 13:26    Procedures Procedures (including critical care time)  EMERGENCY DEPARTMENT BILIARY ULTRASOUND INTERPRETATION "Study: Limited Abdominal Ultrasound of the Gallbladder and Common Bile Duct."  INDICATIONS: Abdominal pain Indication: Multiple views of the gallbladder and common bile duct were obtained in real-time with a Multi-frequency probe."  PERFORMED BY:  Myself IMAGES ARCHIVED?: Yes LIMITATIONS: Abdominal pain INTERPRETATION: Gallbladder wall normal in thickness, Sonographic Murphy's sign present and Common bile duct normal in size   Medications Ordered in ED Medications  iopamidol (ISOVUE-300) 61 % injection (not administered)  sodium chloride 0.9 % bolus 1,000 mL (0 mLs Intravenous Stopped 11/19/16 1443)  ondansetron (ZOFRAN) injection 4 mg (4 mg Intravenous Given 11/19/16 1256)  morphine 4 MG/ML injection 4 mg (4 mg Intravenous Given 11/19/16 0700)  iopamidol (ISOVUE-300) 61 % injection 30  mL (30 mLs Oral Contrast Given 11/19/16 1500)     Initial Impression / Assessment and Plan / ED Course  I have reviewed the triage vital signs and the nursing notes.  Pertinent labs & imaging results that were available during my care of the patient were reviewed by me and considered in my medical decision making (see chart for details).     Right upper quadrant concerning for gallbladder disease. Bedside ultrasound and exam with positive Murphy's sign.  Labs and imaging grossly unremarkable without evidence to suggest acute cholecystitis, pancreatitis, appendicitis, small bowel obstruction, renal stones, pyelonephritis. Likely gastritis/duodenitis from NSAID use. Patient remained stable and able to tolerate by mouth. Prescription for discharge with strict return precautions which were communicated to patient and husband via Arabic interpreter at length. All questions answered.  Final Clinical Impressions(s) / ED Diagnoses   Final diagnoses:  Abdominal pain  RUQ pain   Disposition: Discharge  Condition: Good  I have discussed the results, Dx and Tx plan with the patient who expressed understanding and agree(s) with the plan. Discharge instructions discussed at great length. The patient was given strict return precautions who verbalized understanding of the instructions. No further questions at time of discharge.    New Prescriptions   ALUM & MAG HYDROXIDE-SIMETH (MAALOX MAX) 400-400-40 MG/5ML SUSPENSION    Take 15 mLs by mouth as needed for indigestion.   FAMOTIDINE (PEPCID) 40 MG TABLET    Take 1 tablet (40 mg total) by mouth daily.   SUCRALFATE (CARAFATE) 1 GM/10ML SUSPENSION    Take 10 mLs (1 g total) by mouth 4 (four) times daily -  with meals and at bedtime.    Follow Up: Primary care provider     [facility]  Schedule an appointment as soon as possible for a visit  if pain persist for ulcer evaluation      Nira Conn, MD 11/19/16 1750

## 2016-11-19 NOTE — ED Triage Notes (Signed)
Patient c/o right mid abdominal and right flank pain x 4 days. Patient also c/o feeling nauseated. Patient denies fever, vomiting, diarrhea, or dysuria.

## 2016-11-19 NOTE — ED Notes (Signed)
RN will collect labs. 

## 2016-11-19 NOTE — ED Notes (Signed)
Failed attempt to collect labs   

## 2016-11-20 LAB — URINE CULTURE

## 2016-11-25 ENCOUNTER — Encounter: Payer: Self-pay | Admitting: Internal Medicine

## 2016-11-25 ENCOUNTER — Ambulatory Visit (INDEPENDENT_AMBULATORY_CARE_PROVIDER_SITE_OTHER): Payer: Self-pay | Admitting: Internal Medicine

## 2016-11-25 VITALS — BP 124/60 | HR 71 | Temp 98.2°F | Ht 63.78 in | Wt 256.0 lb

## 2016-11-25 DIAGNOSIS — R1011 Right upper quadrant pain: Secondary | ICD-10-CM

## 2016-11-25 MED ORDER — KETOROLAC TROMETHAMINE 30 MG/ML IJ SOLN
30.0000 mg | Freq: Once | INTRAMUSCULAR | Status: AC
  Administered 2016-11-25: 30 mg via INTRAMUSCULAR

## 2016-11-25 MED ORDER — CYCLOBENZAPRINE HCL 5 MG PO TABS: 5.0000 mg | ORAL_TABLET | Freq: Three times a day (TID) | ORAL | 0 refills | Status: DC | PRN

## 2016-11-25 NOTE — Patient Instructions (Signed)
You received an injection for pain in the clinic You can take Flexeril as needed for pain as well We made a referral to the Gastroenterologist.

## 2016-11-25 NOTE — Progress Notes (Signed)
   [facility] Phone: [phone]   Date of Visit: 11/25/2016   HPI:  Darlene Cruz is a 31 y.o. female presenting to clinic today for same day appointment. PCP: Beaulah Dinninghristina M Gambino, MD Concerns today include:  - was seen in the ED for RUQ abdominal pain on 11/19/16. CBC, CMP, lipase normal. UA with small hemoglobin, 5 ketones, few bacteria. Urine culture grew multiple species (nonspecific). Urine pregnancy negative. RUQ US was unremarkable. CT abdomen and pelvis also unremarkable. Patient was given Famotidine, Maalox, and Carafate Rx. Only able to get Famotidine and maalox which did not help with symptoms.  - reports of 10 day history of constant RUQ and right flank/back pain without radiation. Reports that the pain is so severe that it makes her "cry, scream, and fall on to the ground" - reports it is worse with movement or walking. Improves if standing or sitting - no dysuria or blood in urine. No fevers. Reports of some nausea due to the pain but no vomiting  - not worsened or better with food/after meals - no diarrhea or constipation  - no history of kidney stones  - no cough or shortness of breath - no recent travel outside the country - reports she started exercising recently with walking, biking, and lifting weights but reports she has not gone back since these symptoms started   ROS: See HPI.  PMFSH:  No significant PMH Medications: Famotidine, maalox  PHYSICAL EXAM: BP 124/60   Pulse 71   Temp 98.2 F (36.8 C) (Oral)   Ht 5' 3.78" (1.62 m)   Wt 256 lb (116.1 kg)   LMP 11/01/2016 (Exact Date)   SpO2 99%   BMI 44.25 kg/m  GEN: NAD, non-toxic appearing  CV: RRR, no murmurs, rubs, or gallops PULM: CTAB, normal effort ABD: Soft, nondistended, tenderness to palpation in the RUQ, right flank, and the right thoracic and lumbar paraspinal muscles. + murphy sign. + bowel sounds, no organomegaly SKIN: No rash or cyanosis; warm and  well-perfused EXTR: No lower extremity edema or calf tenderness PSYCH: Mood and affect euthymic, normal rate and volume of speech NEURO: Awake, alert, no focal deficits grossly, normal speech   ASSESSMENT/PLAN:  Abdominal pain, right upper quadrant Unclear etiology. Normal labs and imaging from recent ED visit for same symptoms. No gallbladder pathology noted on imaging and LFTs/bili are normal as well. Considered MSK etiology possible muscle strain. Due to unclear etiology and patient preference, will refer to GI. Toradol given in clinic for pain. Rx trail of flexeril given; cautioned regarding drowsiness side effect.  - Ambulatory referral to Gastroenterology - ketorolac (TORADOL) 30 MG/ML injection 30 mg; Inject 1 mL (30 mg total) into the muscle once.   Palma HolterKanishka G Tyanne Derocher, MD PGY 2 [facility]

## 2016-11-27 ENCOUNTER — Telehealth: Payer: Self-pay | Admitting: Family Medicine

## 2016-11-27 NOTE — Telephone Encounter (Signed)
Contacted pt via [employer] [phone]/[phone], to let her know that the GI referral could not go any further due to not being able to verify pt insurance.  Informed pt that she should contact Medicaid and check on this and that if she got everything taken care of she could then contact us and we could resend the referral. Lamonte SakaiZimmerman Rumple, April D, CMA

## 2016-11-27 NOTE — Telephone Encounter (Signed)
Darlene Cruz with [facility] called. She is unable to verify pts medicaid.  Because of this , they are unable to see the pt. Please advise

## 2017-05-18 ENCOUNTER — Encounter (HOSPITAL_COMMUNITY): Payer: Self-pay | Admitting: Emergency Medicine

## 2017-05-18 ENCOUNTER — Ambulatory Visit (HOSPITAL_COMMUNITY)
Admission: EM | Admit: 2017-05-18 | Discharge: 2017-05-18 | Disposition: A | Discharge status: Home / Self Care | Payer: Self-pay | Attending: Family Medicine | Admitting: Family Medicine

## 2017-05-18 DIAGNOSIS — M7582 Other shoulder lesions, left shoulder: Secondary | ICD-10-CM

## 2017-05-18 DIAGNOSIS — M25512 Pain in left shoulder: Secondary | ICD-10-CM

## 2017-05-18 MED ORDER — DEXAMETHASONE SODIUM PHOSPHATE 10 MG/ML IJ SOLN
10.0000 mg | Freq: Once | INTRAMUSCULAR | Status: AC
  Administered 2017-05-18: 10 mg via INTRAMUSCULAR

## 2017-05-18 MED ORDER — KETOROLAC TROMETHAMINE 30 MG/ML IJ SOLN
30.0000 mg | Freq: Once | INTRAMUSCULAR | Status: AC
  Administered 2017-05-18: 30 mg via INTRAMUSCULAR

## 2017-05-18 MED ORDER — DEXAMETHASONE SODIUM PHOSPHATE 10 MG/ML IJ SOLN
INTRAMUSCULAR | Status: AC
  Filled 2017-05-18: qty 1

## 2017-05-18 MED ORDER — CYCLOBENZAPRINE HCL 10 MG PO TABS: 10.0000 mg | ORAL_TABLET | Freq: Two times a day (BID) | ORAL | 0 refills | Status: DC | PRN

## 2017-05-18 MED ORDER — PREDNISONE 10 MG (21) PO TBPK
ORAL_TABLET | ORAL | 0 refills | Status: DC
Start: 2017-05-18 — End: 2017-09-30

## 2017-05-18 MED ORDER — KETOROLAC TROMETHAMINE 30 MG/ML IJ SOLN
INTRAMUSCULAR | Status: AC
  Filled 2017-05-18: qty 1

## 2017-05-18 NOTE — Discharge Instructions (Signed)
You most likely have an injury to your rotator cuff. I recommend rest, muscle relaxers, and a short course of prednisone. If your pain persists, or fails to resolve, I will follow-up with an orthopedic provider for further evaluation and management

## 2017-05-18 NOTE — ED Provider Notes (Signed)
  [facility]   [id number] 05/18/17 Arrival Time: 1912   SUBJECTIVE:  Darlene Cruz is a 31 y.o. female who presents to the urgent care with complaint of left shoulder pain for 4 days. Has a job with repetitive motion, denies history of trauma, fever, chills, N/V/D/ unexpected weight loss or other concerning symptoms     No past medical history on file. Social History   Social History  . Marital status: Married    Spouse name: N/A  . Number of children: N/A  . Years of education: N/A   Occupational History  . Not on file.   Social History Main Topics  . Smoking status: Former Smoker    Packs/day: 0.20    Years: 5.00    Types: Cigarettes  . Smokeless tobacco: Never Used     Comment: quit July 2016   . Alcohol use No  . Drug use: No  . Sexual activity: Yes   Other Topics Concern  . Not on file   Social History Narrative   Level of education: High school    Transportation: bus    Housing situation: apt   Relationships (safe): yes      No outpatient prescriptions have been marked as taking for the 05/18/17 encounter [facility](Hospital Encounter).   No Known Allergies    ROS: As per HPI, remainder of ROS negative.   OBJECTIVE:  There were no vitals filed for this visit.     General Appearance:  awake, alert, oriented, in no acute distress, well developed, well nourished and in no acute distress Skin:  skin color, texture, turgor are normal Head/face:  NCAT Back:  no pain to palpation Lungs:  Normal expansion.  Clear to auscultation.  No rales, rhonchi, or wheezing. Heart:  Heart regular rate and rhythm Joint:  L shoulder:  Tenderness with palpation along the infraspinatus, pain with abduction, and flexion, positive empty can test pain at the insertion site of the biceps tendon Peripheral Pulses:  Capillary refill <2secs, strong peripheral pulses Neurologic:  Alert and oriented     Labs: Labs Reviewed - No data to display  No results  found.     ASSESSMENT & PLAN:  1. Tendinitis of left rotator cuff     Meds ordered this encounter  Medications  . ketorolac (TORADOL) 30 MG/ML injection 30 mg  . dexamethasone (DECADRON) injection 10 mg  . predniSONE (STERAPRED UNI-PAK 21 TAB) 10 MG (21) TBPK tablet    Sig: Take 6 tablets tomorrow, decrease by 1 each day till finished (6,5,4,3,2,1)    Dispense:  21 tablet    Refill:  0    Order Specific Question:   Supervising Provider    AnswerMardella Layman:   HAGLER, BRIAN [[id number]]  . cyclobenzaprine (FLEXERIL) 10 MG tablet    Sig: Take 1 tablet (10 mg total) by mouth 2 (two) times daily as needed for muscle spasms.    Dispense:  20 tablet    Refill:  0    Order Specific Question:   Supervising Provider    Answer:   Mardella LaymanHAGLER, BRIAN [[id number]]    Reviewed expectations re: course of current medical issues. Questions answered. Outlined signs and symptoms indicating need for more acute intervention. Patient verbalized understanding. After Visit Summary given.    Procedures:        Dorena BodoKennard, Zollie Clemence, NP 05/18/17 2104

## 2017-05-18 NOTE — ED Notes (Signed)
Waiting for translator equipment, currently in use.

## 2017-05-18 NOTE — ED Triage Notes (Signed)
Assessed by provider

## 2017-08-16 ENCOUNTER — Ambulatory Visit: Payer: Medicaid Other | Admitting: Family Medicine

## 2017-08-16 ENCOUNTER — Other Ambulatory Visit: Payer: Self-pay

## 2017-08-16 VITALS — BP 106/60 | HR 67 | Temp 98.5°F | Ht 63.78 in | Wt 232.6 lb

## 2017-08-16 DIAGNOSIS — R42 Dizziness and giddiness: Secondary | ICD-10-CM | POA: Diagnosis not present

## 2017-08-16 DIAGNOSIS — R55 Syncope and collapse: Secondary | ICD-10-CM | POA: Diagnosis not present

## 2017-08-16 LAB — POCT HEMOGLOBIN: Hemoglobin: 11.8 g/dL — AB (ref 12.2–16.2)

## 2017-08-16 NOTE — Progress Notes (Signed)
    Subjective:  Darlene Cruz is a 31 y.o. female [facility] with [facility] chief complaint of [facility]iness  HPI:  Fall with loss of consciousness:  One week ago patient felt dizzy and feel onto her bed and reported loss of consciousness. Dizziness and feeling sweaty and headache just prior to falling. Additionally, had some palpitations, but denied any chest pain or SOB just prior. Denies any formal history of anxiety or depression, but does note feeling anxious at times. Patient is concerned because her mom has heart issues. Reports that mom has heart disease and had her first heart attack at age 31.  Additionally has persistent dizziness and some left ear pain. Feels like the room is spinning. Denies any recent URI.  Denies fevers, chills, nausea, vomiting or diarrhea. Denies any changes in hearing or feelings of fullness.   Objective:  Physical Exam: BP 106/60   Pulse 67   Temp 98.5 F (36.9 C) (Oral)   Ht 5' 3.78" (1.62 m)   Wt 232 lb 9.6 oz (105.5 kg)   SpO2 99%   BMI 40.20 kg/m   Gen: 31yo F in NAD, resting comfortably HEENT: AT/[place], EOMI, PERRL, TMs clear bilaterally, clear oropharynx, no nasal drainage, MMM CV: RRR with no murmurs appreciated Pulm: NWOB, CTAB with no crackles, wheezes, or rhonchi GI: Normal bowel sounds present. Soft, Nontender, Nondistended. MSK: no edema, cyanosis, or clubbing noted Skin: warm, dry Neuro: grossly normal, moves all extremities Psych: Normal affect and thought content  Orthostatic VS for the past 24 hrs:  BP- Lying BP- Sitting BP- Standing at 0 minutes  08/16/17 1227 98/60 102/58 (!) 88/58     No results found for this or any previous visit (from the past 72 hour(s)).   Assessment/Plan:  Fall with reported loss of consciousness Given the patient's reported fall with brief loss of consciousness happened with preceding prodromal symptoms and did not have any associated chest pain or shortness of breath this is most likely  a vasovagal syncopal episode.  Her residual dizziness with feelings like the room is spinning and associated left ear pain may or may not be associated with this episode.  Her TMs were clear with no erythema exudates or effusion.  She had no pain with tragal manipulation.  Orthostatic vital signs were negative.  At this time will hold off on prescribing medication like meclizine and will give patient another week or so to see if symptoms resolve.  Given return precautions that if she has another episode of syncope with associated chest pain or shortness of breath that this is an emergency.  If she develops hearing loss with persistent ear pain she may need a ENT referral.  If her dizziness continues could consider a course of Flonase and decongestant to treat empirically for eustachian tube dysfunction or with meclizine/vestibular rehab referral.  Recommended patient follow-up with primary care doctor within a month for health maintenance items.  Daniel L. Myrtie SomanWarden, MD [facility] Resident PGY-2 08/16/2017 1:52 PM

## 2017-08-16 NOTE — Patient Instructions (Addendum)
Darlene Cruz, you were seen today for for ongoing dizziness and ear pain after having an fall last week.  Your fall is most likely due to what's called a vasovagal syncope. I have provided some details about that below. If this occurs again I would like you to follow up and be seen again. If you have any changes in hearing you may need to be seen by an ear nose and throat surgeon.  If you develop chest pain and shortness of breath I would be seen urgently. We are also checking your hemoglobin today to make sure this is not low.  Would recommend watching the dizziness and if this persists, please be seen again and we can consider a medication like meclizine.   Please follow up with your primary doctor, Dr. Jonathon JordanGambino in 4 weeks or sooner for a follow up.   Take care, Leandria Thier L. Myrtie SomanWarden, MD [facility] Resident PGY-2 08/16/2017 12:19 PM

## 2017-08-25 ENCOUNTER — Other Ambulatory Visit: Payer: Self-pay

## 2017-08-25 ENCOUNTER — Encounter: Payer: Self-pay | Admitting: Family Medicine

## 2017-08-25 ENCOUNTER — Ambulatory Visit: Payer: Medicaid Other | Attending: Family Medicine | Admitting: Family Medicine

## 2017-08-25 VITALS — BP 94/62 | HR 62 | Temp 98.9°F | Resp 18 | Ht 64.0 in | Wt 234.4 lb

## 2017-08-25 DIAGNOSIS — Z87898 Personal history of other specified conditions: Secondary | ICD-10-CM | POA: Diagnosis not present

## 2017-08-25 DIAGNOSIS — R001 Bradycardia, unspecified: Secondary | ICD-10-CM

## 2017-08-25 DIAGNOSIS — Z23 Encounter for immunization: Secondary | ICD-10-CM

## 2017-08-25 DIAGNOSIS — R51 Headache: Secondary | ICD-10-CM | POA: Insufficient documentation

## 2017-08-25 DIAGNOSIS — R5383 Other fatigue: Secondary | ICD-10-CM | POA: Diagnosis not present

## 2017-08-25 DIAGNOSIS — R519 Headache, unspecified: Secondary | ICD-10-CM

## 2017-08-25 DIAGNOSIS — R55 Syncope and collapse: Secondary | ICD-10-CM | POA: Diagnosis not present

## 2017-08-25 DIAGNOSIS — Z0000 Encounter for general adult medical examination without abnormal findings: Secondary | ICD-10-CM | POA: Diagnosis not present

## 2017-08-25 MED ORDER — ACETAMINOPHEN 500 MG PO TABS: 1000.0000 mg | ORAL_TABLET | Freq: Four times a day (QID) | ORAL | 0 refills | Status: DC | PRN

## 2017-08-25 NOTE — Progress Notes (Signed)
Patient complains weakness headaches no balance dizziness

## 2017-08-25 NOTE — Progress Notes (Signed)
Subjective:  Patient ID: Darlene Cruz, female    DOB: 04/12/1986  Age: 31 y.o. MRN: [id number]  CC: Fatigue and Headache   HPI Darlene Cruz presents for complaints of weakness and headaches. Onset of symptoms was 10 days ago. Sudden onset of  fatigue, near syncope, dizziness; she reports going to bed to sleep . Symptoms include fatigue, palpitations, and sweating. Headaches bilateral extending from checks to generalized head. Denies any nasal, sinus, ear pain, vomiting, family history of neurological disease or aneurysm. Taking  ibuprofen for symptoms with relief of headaches. No history migraine headaches. Stop working due to shoulder pain in 3 months ago, she does report feelings of sadness and worry because she wants to work outside of the home but is recovering from history of shoulder strain . She denies any HI/SI. She declines speaking with the LCSW at this time.   Outpatient Medications Prior to Visit  Medication Sig Dispense Refill  . alum & mag hydroxide-simeth (MAALOX MAX) 400-400-40 MG/5ML suspension Take 15 mLs by mouth as needed for indigestion. (Patient not taking: Reported on 08/25/2017) 355 mL 0  . cyclobenzaprine (FLEXERIL) 10 MG tablet Take 1 tablet (10 mg total) by mouth 2 (two) times daily as needed for muscle spasms. (Patient not taking: Reported on 08/25/2017) 20 tablet 0  . famotidine (PEPCID) 40 MG tablet Take 1 tablet (40 mg total) by mouth daily. 30 tablet 0  . predniSONE (STERAPRED UNI-PAK 21 TAB) 10 MG (21) TBPK tablet Take 6 tablets tomorrow, decrease by 1 each day till finished (6,5,4,3,2,1) (Patient not taking: Reported on 08/25/2017) 21 tablet 0  . sucralfate (CARAFATE) 1 GM/10ML suspension Take 10 mLs (1 g total) by mouth 4 (four) times daily -  with meals and at bedtime. (Patient not taking: Reported on 08/25/2017) 420 mL 0   No facility-administered medications prior to visit.     ROS Review of Systems  Constitutional: Positive for diaphoresis and  fatigue.  HENT: Negative for congestion, ear pain, sinus pressure, sinus pain and tinnitus.   Respiratory: Negative.   Cardiovascular: Positive for palpitations.  Gastrointestinal: Negative for vomiting.  Neurological: Positive for dizziness, syncope (near) and headaches.  Psychiatric/Behavioral: Positive for dysphoric mood. Negative for suicidal ideas. The patient is nervous/anxious.    Objective:  BP 94/62 (BP Location: Left Arm, Patient Position: Sitting, Cuff Size: Normal)   Pulse 62   Temp 98.9 F (37.2 C) (Oral)   Resp 18   Ht 5\' 4"  (1.626 m)   Wt 234 lb 6.4 oz (106.3 kg)   SpO2 98%   BMI 40.23 kg/m   BP/Weight 08/25/2017 08/16/2017 11/25/2016  Systolic BP 94 106 124  Diastolic BP 62 60 60  Wt. (Lbs) 234.4 232.6 256  BMI 40.23 40.2 44.25     Physical Exam  Constitutional: She appears well-developed and well-nourished.  HENT:  Head: Normocephalic and atraumatic.  Right Ear: External ear normal.  Left Ear: External ear normal.  Nose: Nose normal.  Mouth/Throat: Oropharynx is clear and moist.  Eyes: Conjunctivae and EOM are normal. Pupils are equal, round, and reactive to light.  Neck: No JVD present.  Cardiovascular: Normal rate, regular rhythm, normal heart sounds and intact distal pulses.  Pulmonary/Chest: Effort normal and breath sounds normal.  Abdominal: Soft. Bowel sounds are normal. There is no tenderness.  Skin: Skin is warm and dry.  Psychiatric: She has a normal mood and affect. She expresses no homicidal and no suicidal ideation. She expresses no suicidal plans and no  homicidal plans.  Nursing note and vitals reviewed.  Assessment & Plan:   1. Fatigue, unspecified type  - TSH - CBC  2. Generalized headaches  - acetaminophen (TYLENOL) 500 MG tablet; Take 2 tablets (1,000 mg total) by mouth every 6 (six) hours as needed for headache.  Dispense: 30 tablet; Refill: 0  3. History of palpitations  - EKG 12-Lead - TSH - CBC - Basic Metabolic Panel -  Ambulatory referral to Cardiology  4. Near syncope Orthostatic VS taken. BP low end normal, SB on EKG, with history of near syncope, and heart palpititions. Will refer to cardiology for  further workup. Increase water and moderate salt intake.  - EKG 12-Lead - CBC - Basic Metabolic Panel - Ambulatory referral to Cardiology  5. Health care maintenance  - Tdap vaccine greater than or equal to 7yo IM  6. Sinus bradycardia on ECG  - Ambulatory referral to Cardiology     Follow-up: Return in about 2 months (around 10/26/2017), or if symptoms worsen or fail to improve, for Headaches.   Lizbeth BarkMandesia R Kellyann Ordway FNP

## 2017-08-26 ENCOUNTER — Other Ambulatory Visit: Payer: Medicaid Other

## 2017-08-26 ENCOUNTER — Other Ambulatory Visit: Payer: Self-pay | Admitting: Family Medicine

## 2017-08-26 DIAGNOSIS — R51 Headache: Principal | ICD-10-CM

## 2017-08-26 DIAGNOSIS — R519 Headache, unspecified: Secondary | ICD-10-CM

## 2017-08-26 LAB — BASIC METABOLIC PANEL
BUN/Creatinine Ratio: 18 (ref 9–23)
BUN: 10 mg/dL (ref 6–20)
CO2: 23 mmol/L (ref 20–29)
CREATININE: 0.55 mg/dL — AB (ref 0.57–1.00)
Calcium: 9.6 mg/dL (ref 8.7–10.2)
Chloride: 104 mmol/L (ref 96–106)
GFR calc Af Amer: 145 mL/min/{1.73_m2} (ref >59)
GFR calc non Af Amer: 125 mL/min/{1.73_m2} (ref >59)
GLUCOSE: 80 mg/dL (ref 65–99)
Potassium: 4.7 mmol/L (ref 3.5–5.2)
SODIUM: 143 mmol/L (ref 134–144)

## 2017-08-26 LAB — TSH: TSH: 1.25 u[IU]/mL (ref 0.450–4.500)

## 2017-08-26 LAB — CBC
HEMOGLOBIN: 12.9 g/dL (ref 11.1–15.9)
Hematocrit: 37.3 % (ref 34.0–46.6)
MCH: 29.5 pg (ref 26.6–33.0)
MCHC: 34.6 g/dL (ref 31.5–35.7)
MCV: 85 fL (ref 79–97)
Platelets: 242 10*3/uL (ref 150–379)
RBC: 4.37 x10E6/uL (ref 3.77–5.28)
RDW: 14.3 % (ref 12.3–15.4)
WBC: 7.8 10*3/uL (ref 3.4–10.8)

## 2017-09-02 ENCOUNTER — Telehealth: Payer: Self-pay

## 2017-09-02 NOTE — Telephone Encounter (Signed)
Arabic interpreter: Gwenyth BouillonMohammad [phone]   Patient informed on lab result.   Patient verified DOB.

## 2017-09-02 NOTE — Telephone Encounter (Signed)
Patient wanted to talk to PCP about her middle ear and may be causing her to feel weak.

## 2017-09-02 NOTE — Telephone Encounter (Signed)
-----   Message from Lizbeth BarkMandesia R Hairston, FNP sent at 09/02/2017 12:11 PM EST ----- Thyroid function normal. When decreased can cause fatigue. Labs that evaluated your blood cells, fluid and electrolyte balance are normal. No signs of anemia, infection, or inflammation present.

## 2017-09-08 ENCOUNTER — Ambulatory Visit: Payer: Medicaid Other | Attending: Family Medicine

## 2017-09-09 ENCOUNTER — Encounter: Payer: Self-pay | Admitting: *Deleted

## 2017-09-30 ENCOUNTER — Encounter: Payer: Self-pay | Admitting: Interventional Cardiology

## 2017-09-30 ENCOUNTER — Ambulatory Visit (INDEPENDENT_AMBULATORY_CARE_PROVIDER_SITE_OTHER): Payer: Medicaid Other

## 2017-09-30 ENCOUNTER — Ambulatory Visit: Payer: Medicaid Other | Admitting: Interventional Cardiology

## 2017-09-30 VITALS — BP 116/56 | HR 71 | Ht 64.0 in | Wt 236.8 lb

## 2017-09-30 DIAGNOSIS — Z720 Tobacco use: Secondary | ICD-10-CM

## 2017-09-30 DIAGNOSIS — R002 Palpitations: Secondary | ICD-10-CM | POA: Diagnosis not present

## 2017-09-30 DIAGNOSIS — R001 Bradycardia, unspecified: Secondary | ICD-10-CM | POA: Diagnosis not present

## 2017-09-30 DIAGNOSIS — R0602 Shortness of breath: Secondary | ICD-10-CM | POA: Diagnosis not present

## 2017-09-30 NOTE — Progress Notes (Signed)
Cardiology Office Note   Date:  09/30/2017   ID:  Darlene Cruz, DOB 09/08/1986, MRN [id number]  PCP:  Lizbeth BarkHairston, Mandesia R, FNP    No chief complaint on file.  bradycardia  Wt Readings from Last 3 Encounters:  09/30/17 236 lb 12.8 oz (107.4 kg)  08/25/17 234 lb 6.4 oz (106.3 kg)  08/16/17 232 lb 9.6 oz (105.5 kg)       History of Present Illness: Darlene Cruz is a 32 y.o. female who is being seen today for the evaluation of bradycardia at the request of Hairston, Mandesia R, F*.  She was seen by her PMD with complaint of fatigue.  She was found to have sinus bradycardia on exam.   She is here with her husband and an interpreter.  She had a syncopal spell in 12/18.  She had a prodrome and it was thought to be vasovagal.   She continues to have palpitatons and dizziness, but these have improved.  Sx come a few times per week.  Sx last around 5-6 minutes.  BP may be on the low side, with systolic in the 90s.  Pulse will be in the 60-70 range.    She has had some tests regarding her blood sugar.  She has had some low blood sugars as well.    She feels some SHOB.  It occurs at night.    Most strenuous activity is housework.  No regular exercise recently until a few days ago.  She went back to the gym and did some walking.  She felt tired.  She lost weight a few months ago, with exercise.  She was watching her diet as well.   She is trying to quit smoking.     Past Medical History:  Diagnosis Date  . Abdominal cramping 06/05/2015  . Dizziness 06/05/2015  . Ear pain 06/05/2015  . Pelvic pain in female 03/12/2016  . Weight gain 06/05/2015    Past Surgical History:  Procedure Laterality Date  . CESAREAN SECTION    . CESAREAN SECTION    . KNEE SURGERY Left   . TONSILLECTOMY AND ADENOIDECTOMY       Current Outpatient Medications  Medication Sig Dispense Refill  . acetaminophen (TYLENOL) 500 MG tablet Take 2 tablets (1,000 mg total) by mouth every 6 (six)  hours as needed for headache. 30 tablet 0  . Cholecalciferol (VITAMIN D PO) Take 1 tablet by mouth once a week.     No current facility-administered medications for this visit.     Allergies:   Patient has no known allergies.    Social History:  The patient  reports that she has quit smoking. Her smoking use included cigarettes. She has a 1.00 pack-year smoking history. she has never used smokeless tobacco. She reports that she does not drink alcohol or use drugs.   Family History:  The patient's family history includes Diabetes in her mother; Hyperlipidemia in her mother; Hypertension in her mother.    ROS:  Please see the history of present illness.   Otherwise, review of systems are positive for fatigue.   All other systems are reviewed and negative.    PHYSICAL EXAM: VS:  BP (!) 116/56 (BP Location: Right Arm, Patient Position: Sitting, Cuff Size: Large)   Pulse 71   Ht 5\' 4"  (1.626 m)   Wt 236 lb 12.8 oz (107.4 kg)   SpO2 99%   BMI 40.65 kg/m  , BMI Body mass index is 40.65 kg/m.  GEN: Well nourished, well developed, in no acute distress  HEENT: normal  Neck: no JVD, carotid bruits, or masses Cardiac: RRR; no murmurs, rubs, or gallops,no edema  Respiratory:  clear to auscultation bilaterally, normal work of breathing GI: soft, nontender, nondistended, + BS MS: no deformity or atrophy  Skin: warm and dry, no rash Neuro:  Strength and sensation are intact Psych: euthymic mood, full affect   EKG:   The ekg ordered in 12/18 demonstrates sinus bradycardia, otherwise normal   Recent Labs: 11/19/2016: ALT 25 08/25/2017: BUN 10; Creatinine, Ser 0.55; Hemoglobin 12.9; Platelets 242; Potassium 4.7; Sodium 143; TSH 1.250   Lipid Panel No results found for: CHOL, TRIG, HDL, CHOLHDL, VLDL, LDLCALC, LDLDIRECT   Other studies Reviewed: Additional studies/ records that were reviewed today with results demonstrating: 2018 abdominal CT showed no atherosclerosis and no  AAA.   ASSESSMENT AND PLAN:  1. Bradycardia: Normal range.  No indication for pacer. 2. Fatigue/SHOB: I think this is likely from deconditioning.  I encouraged her to get back to exercise and continue the healthy lifestyle to lose weight.  Stamina will get better with weight loss and more exercise.   3. Palpitations: Plan for 30 day monitor.  Decrease caffeine intake.   4. Tobacco abuse: she has cut back.  She needs to stop completely, especially given family h/o CAD.     Current medicines are reviewed at length with the patient today.  The patient concerns regarding her medicines were addressed.  The following changes have been made:  No change  Labs/ tests ordered today include:  No orders of the defined types were placed in this encounter.   Recommend 150 minutes/week of aerobic exercise Low fat, low carb, high fiber diet recommended  Disposition:   FU post monitor and echo   Alvera Singh, MD  09/30/2017 11:17 AM    [facility] [address] [place], [place], [place]  [address] Phone: [phone]; Fax: [phone]

## 2017-09-30 NOTE — Patient Instructions (Addendum)
Medication Instructions:  Your physician recommends that you continue on your current medications as directed. Please refer to the Current Medication list given to you today.   Labwork: None ordered  Testing/Procedures: Your physician has requested that you have an echocardiogram. Echocardiography is a painless test that uses sound waves to create images of your heart. It provides your doctor with information about the size and shape of your heart and how well your heart's chambers and valves are working. This procedure takes approximately one hour. There are no restrictions for this procedure.  Your physician has recommended that you wear an event monitor. Event monitors are medical devices that record the heart's electrical activity. Doctors most often [place] these monitors to diagnose arrhythmias. Arrhythmias are problems with the speed or rhythm of the heartbeat. The monitor is a small, portable device. You can wear one while you do your normal daily activities. This is usually used to diagnose what is causing palpitations/syncope (passing out).   Follow-Up: Your physician recommends that you schedule a follow-up appointment in: 6-8 weeks with Dr. Eldridge Dace    Any Other Special Instructions Will Be Listed Below (If Applicable).  DECREASE your caffeine intake    Echocardiogram An echocardiogram, or echocardiography, uses sound waves (ultrasound) to produce an image of your heart. The echocardiogram is simple, painless, obtained within a short period of time, and offers valuable information to your health care provider. The images from an echocardiogram can provide information such as:  Evidence of coronary artery disease (CAD).  Heart size.  Heart muscle function.  Heart valve function.  Aneurysm detection.  Evidence of a past heart attack.  Fluid buildup around the heart.  Heart muscle thickening.  Assess heart valve function.  Tell a health care provider about:  Any  allergies you have.  All medicines you are taking, including vitamins, herbs, eye drops, creams, and over-the-counter medicines.  Any problems you or family members have had with anesthetic medicines.  Any blood disorders you have.  Any surgeries you have had.  Any medical conditions you have.  Whether you are pregnant or may be pregnant. What happens before the procedure? No special preparation is needed. Eat and drink normally. What happens during the procedure?  In order to produce an image of your heart, gel will be applied to your chest and a wand-like tool (transducer) will be moved over your chest. The gel will help transmit the sound waves from the transducer. The sound waves will harmlessly bounce off your heart to allow the heart images to be captured in real-time motion. These images will then be recorded.  You may need an IV to receive a medicine that improves the quality of the pictures. What happens after the procedure? You may return to your normal schedule including diet, activities, and medicines, unless your health care provider tells you otherwise. This information is not intended to replace advice given to you by your health care provider. Make sure you discuss any questions you have with your health care provider. Document Released: 08/28/2000 Document Revised: 04/18/2016 Document Reviewed: 05/08/2013 Elsevier Interactive Patient Education  2017 Elsevier Inc.    Cardiac Event Monitoring A cardiac event monitor is a small recording device that is used to detect abnormal heart rhythms (arrhythmias). The monitor is used to record your heart rhythm when you have symptoms, such as:  Fast heartbeats (palpitations), such as heart racing or fluttering.  Dizziness.  Fainting or light-headedness.  Unexplained weakness.  Some monitors are wired to electrodes placed  on your chest. Electrodes are flat, sticky disks that attach to your skin. Other monitors may be  hand-held or worn on the wrist. The monitor can be worn for up to 30 days. If the monitor is attached to your chest, a technician will prepare your chest for the electrode placement and show you how to work the monitor. Take time to practice using the monitor before you leave the office. Make sure you understand how to send the information from the monitor to your health care provider. In some cases, you may need to use a landline telephone instead of a cell phone. What are the risks? Generally, this device is safe to use, but it possible that the skin under the electrodes will become irritated. How to use your cardiac event monitor  Wear your monitor at all times, except when you are in water: ? Do not let the monitor get wet. ? Take the monitor off when you bathe. Do not swim or use a hot tub with it on.  Keep your skin clean. Do not put body lotion or moisturizer on your chest.  Change the electrodes as told by your health care provider or any time they stop sticking to your skin. You may need to use medical tape to keep them on.  Try to put the electrodes in slightly different places on your chest to help prevent skin irritation. They must remain in the area under your left breast and in the upper right section of your chest.  Make sure the monitor is safely clipped to your clothing or in a location close to your body that your health care provider recommends.  Press the button to record as soon as you feel heart-related symptoms, such as: ? Dizziness. ? Weakness. ? Light-headedness. ? Palpitations. ? Thumping or pounding in your chest. ? Shortness of breath. ? Unexplained weakness.  Keep a diary of your activities, such as walking, doing chores, and taking medicine. It is very important to note what you were doing when you pushed the button to record your symptoms. This will help your health care provider determine what might be contributing to your symptoms.  Send the recorded  information as recommended by your health care provider. It may take some time for your health care provider to process the results.  Change the batteries as told by your health care provider.  Keep electronic devices away from your monitor. This includes: ? Tablets. ? MP3 players. ? Cell phones.  While wearing your monitor you should avoid: ? Electric blankets. ? Firefighter. ? Electric toothbrushes. ? Microwave ovens. ? Magnets. ? Metal detectors. Get help right away if:  You have chest pain.  You have extreme difficulty breathing or shortness of breath.  You develop a very fast heartbeat that persists.  You develop dizziness that does not go away.  You faint or constantly feel like you are about to faint. Summary  A cardiac event monitor is a small recording device that is used to help detect abnormal heart rhythms (arrhythmias).  The monitor is used to record your heart rhythm when you have heart-related symptoms.  Make sure you understand how to send the information from the monitor to your health care provider.  It is important to press the button on the monitor when you have any heart-related symptoms.  Keep a diary of your activities, such as walking, doing chores, and taking medicine. It is very important to note what you were doing when you pushed the  button to record your symptoms. This will help your health care provider learn what might be causing your symptoms. This information is not intended to replace advice given to you by your health care provider. Make sure you discuss any questions you have with your health care provider. Document Released: 06/09/2008 Document Revised: 08/15/2016 Document Reviewed: 08/15/2016 Elsevier Interactive Patient Education  2017 [employer]  If you need a refill on your cardiac medications before your next appointment, please call your pharmacy.

## 2017-10-08 ENCOUNTER — Other Ambulatory Visit: Payer: Self-pay

## 2017-10-08 ENCOUNTER — Ambulatory Visit (HOSPITAL_COMMUNITY): Payer: Medicaid Other | Attending: Internal Medicine

## 2017-10-08 DIAGNOSIS — R002 Palpitations: Secondary | ICD-10-CM | POA: Diagnosis not present

## 2017-10-08 DIAGNOSIS — R0602 Shortness of breath: Secondary | ICD-10-CM

## 2017-10-08 DIAGNOSIS — Z720 Tobacco use: Secondary | ICD-10-CM | POA: Insufficient documentation

## 2017-10-08 NOTE — Progress Notes (Unsigned)
Interpreter, Clemens CatholicSarra Gabralla was present.

## 2017-10-12 ENCOUNTER — Telehealth: Payer: Self-pay | Admitting: Interventional Cardiology

## 2017-10-12 NOTE — Telephone Encounter (Signed)
New Message   Patient is returning call in reference to her echocardiogram results. Please call.

## 2017-10-12 NOTE — Telephone Encounter (Signed)
Monitor showing only NSR with occasional PVCs

## 2017-10-12 NOTE — Telephone Encounter (Signed)
Returned call to patient using Arabic interpreter [phone]. Explained echocardiogram results to patient and advised her to continue to wear her heart monitor. Patient verbalized understanding per interpreter. Patient states that she had an episode 4 nights ago where her chest felt tight because her heart was beating fast and she was short of breath. She states that this was late at night and it last for a few minutes and it went away. She denies any recurrent episodes. She states that she pressed the button. Made patient aware that it is reassuring that since she did not receive a phone call that she would have been contacted if it were an abnormal rhythm that needed attention. Made her aware that I would double check to see if there was a report sent over that warranted attention. Made her aware that I would call her back if there were any concerns. Advised her to continue to wear her monitor for now. Patient verbalized understanding per interpreter and did not have any further questions.

## 2017-11-10 ENCOUNTER — Ambulatory Visit: Payer: Medicaid Other

## 2017-11-15 ENCOUNTER — Ambulatory Visit (INDEPENDENT_AMBULATORY_CARE_PROVIDER_SITE_OTHER): Payer: Medicaid Other | Admitting: Interventional Cardiology

## 2017-11-15 ENCOUNTER — Encounter: Payer: Self-pay | Admitting: Interventional Cardiology

## 2017-11-15 ENCOUNTER — Ambulatory Visit: Payer: Medicaid Other | Admitting: Interventional Cardiology

## 2017-11-15 VITALS — BP 120/68 | HR 68 | Ht 64.0 in | Wt 246.0 lb

## 2017-11-15 DIAGNOSIS — Z720 Tobacco use: Secondary | ICD-10-CM | POA: Diagnosis not present

## 2017-11-15 DIAGNOSIS — R001 Bradycardia, unspecified: Secondary | ICD-10-CM | POA: Diagnosis not present

## 2017-11-15 DIAGNOSIS — R002 Palpitations: Secondary | ICD-10-CM

## 2017-11-15 DIAGNOSIS — R0602 Shortness of breath: Secondary | ICD-10-CM

## 2017-11-15 NOTE — Patient Instructions (Signed)
Medication Instructions:  Your physician recommends that you continue on your current medications as directed. Please refer to the Current Medication list given to you today.   Labwork: None ordered  Testing/Procedures: None ordered  Follow-Up: Your physician wants you to follow-up AS NEEDED  Any Other Special Instructions Will Be Listed Below (If Applicable).     If you need a refill on your cardiac medications before your next appointment, please call your pharmacy.   

## 2017-11-15 NOTE — Progress Notes (Signed)
Cardiology Office Note   Date:  11/15/2017   ID:  Darlene Cruz, DOB March 26, 1986, MRN [id number]  PCP:  Darlene Bark, FNP    No chief complaint on file.  fatigue  Wt Readings from Last 3 Encounters:  11/15/17 246 lb (111.6 kg)  09/30/17 236 lb 12.8 oz (107.4 kg)  08/25/17 234 lb 6.4 oz (106.3 kg)       History of Present Illness: Darlene Cruz is a 32 y.o. female  with complaint of fatigue.  She was found to have sinus bradycardia on exam.   She is here with her husband and an interpreter.  Prior sx included: She had a syncopal spell in 12/18.  She had a prodrome and it was thought to be vasovagal. Palpitatons and dizziness..  Sx came a few times per week.  Sx last around 5-6 minutes.  BP may be on the low side, with systolic in the 90s.  Pulse will be in the 60-70 range.    She has had some tests regarding her blood sugar.  She has had some low blood sugars as well.  Occasional SHOB.  It occurs at night.    She was trying to quit smoking. Since the last visit, monitor showed:    Normal sinus rhythm.  Occasional PVCs which did not correlate to symptoms of palpitations.  No pathologic arrythmias.   Echo showed normal LV function and normal valvular function.  She has been eating more regularly due to "low blood sugar", per her report.  SHe has gained 10 lbs.  She states she was told not to exercise.    History with help of interpreter.   Past Medical History:  Diagnosis Date  . Abdominal cramping 06/05/2015  . Dizziness 06/05/2015  . Ear pain 06/05/2015  . Pelvic pain in female 03/12/2016  . Weight gain 06/05/2015    Past Surgical History:  Procedure Laterality Date  . CESAREAN SECTION    . CESAREAN SECTION    . KNEE SURGERY Left   . TONSILLECTOMY AND ADENOIDECTOMY       Current Outpatient Medications  Medication Sig Dispense Refill  . acetaminophen (TYLENOL) 500 MG tablet Take 2 tablets (1,000 mg total) by mouth every 6 (six) hours as  needed for headache. 30 tablet 0  . Cholecalciferol (VITAMIN D PO) Take 1 tablet by mouth once a week.    Marland Kitchen HYDROcodone-acetaminophen (NORCO/VICODIN) 5-325 MG tablet Take 1 tablet by mouth every 6 (six) hours as needed for moderate pain.    Marland Kitchen ibuprofen (ADVIL,MOTRIN) 600 MG tablet Take 600 mg by mouth every 6 (six) hours as needed for mild pain or moderate pain.    Marland Kitchen penicillin v potassium (VEETID) 500 MG tablet Take 500 mg by mouth 4 (four) times daily. Until gone     No current facility-administered medications for this visit.     Allergies:   Patient has no known allergies.    Social History:  The patient  reports that she has quit smoking. Her smoking use included cigarettes. She has a 1.00 pack-year smoking history. she has never used smokeless tobacco. She reports that she does not drink alcohol or use drugs.   Family History:  The patient's family history includes Diabetes in her mother; Hyperlipidemia in her mother; Hypertension in her mother.    ROS:  Please see the history of present illness.   Otherwise, review of systems are positive for fatigue and plapitations 70% improved.   All other systems  are reviewed and negative.    PHYSICAL EXAM: VS:  BP 120/68   Pulse 68   Ht 5\' 4"  (1.626 m)   Wt 246 lb (111.6 kg)   SpO2 99%   BMI 42.23 kg/m  , BMI Body mass index is 42.23 kg/m. GEN: Well nourished, well developed, in no acute distress  HEENT: normal  Neck: no JVD, carotid bruits, or masses Cardiac: RRR; no murmurs, rubs, or gallops,no edema  Respiratory:  clear to auscultation bilaterally, normal work of breathing GI: soft, nontender, nondistended, + BS MS: no deformity or atrophy  Skin: warm and dry, no rash Neuro:  Strength and sensation are intact Psych: euthymic mood, full affect    Recent Labs: 11/19/2016: ALT 25 08/25/2017: BUN 10; Creatinine, Ser 0.55; Hemoglobin 12.9; Platelets 242; Potassium 4.7; Sodium 143; TSH 1.250   Lipid Panel No results found for:  CHOL, TRIG, HDL, CHOLHDL, VLDL, LDLCALC, LDLDIRECT   Other studies Reviewed: Additional studies/ records that were reviewed today with results demonstrating: as above in the HPI.   ASSESSMENT AND PLAN:  1. Bradycardia: Normal HR rate at this time.   2. Fatigue/SHOB: Symptoms improved.  She needs to exercise more regularly and lose weight.  No restrictions from a cardiac standpoint for exercise.  3. Palpitations: Symptoms improved significantly.  Monitor was negative.  No further workup at this time. 4. Tobacco abuse: She only smokes occasionally.  I asked her to even stop the occasional smoking.   Current medicines are reviewed at length with the patient today.  The patient concerns regarding her medicines were addressed.  The following changes have been made:  No change  Labs/ tests ordered today include:  No orders of the defined types were placed in this encounter.   Recommend 150 minutes/week of aerobic exercise Low fat, low carb, high fiber diet recommended  Disposition:   FU prn   Signed, Lance MussJayadeep Charday Capetillo, MD  11/15/2017 4:43 PM    [facility] [address], [place], [place]  [address] Phone: [phone]; Fax: [phone]

## 2017-11-17 NOTE — Progress Notes (Signed)
Note forwarded to patient's new PCP, Virl AxeWahiba Kartaoui, NP-C

## 2018-10-24 IMAGING — US US ABDOMEN LIMITED
1 series · 14 of 25 positions shown · non-contrast
Comparison: None.

CLINICAL DATA: Four day history of right upper quadrant pain.

EXAM:
US ABDOMEN LIMITED - RIGHT UPPER QUADRANT

[Series 1: us abdomen limited · 0.22mm/px · 14 of 153 slices shown]
[im 1/153]
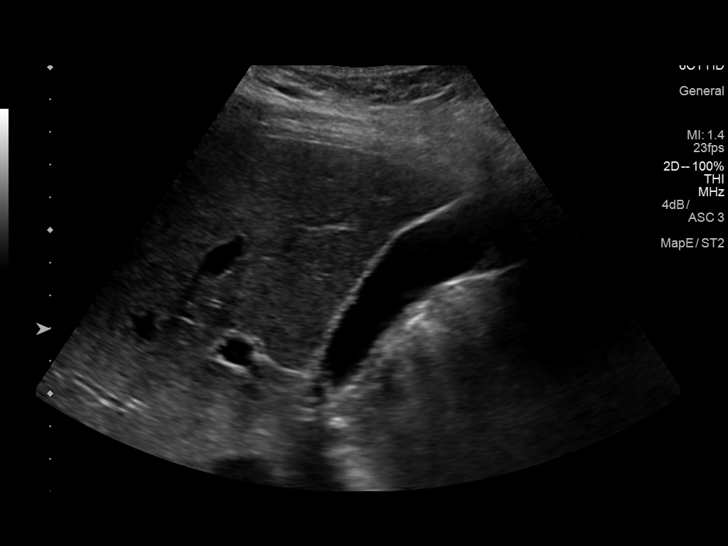
[im 13/153]
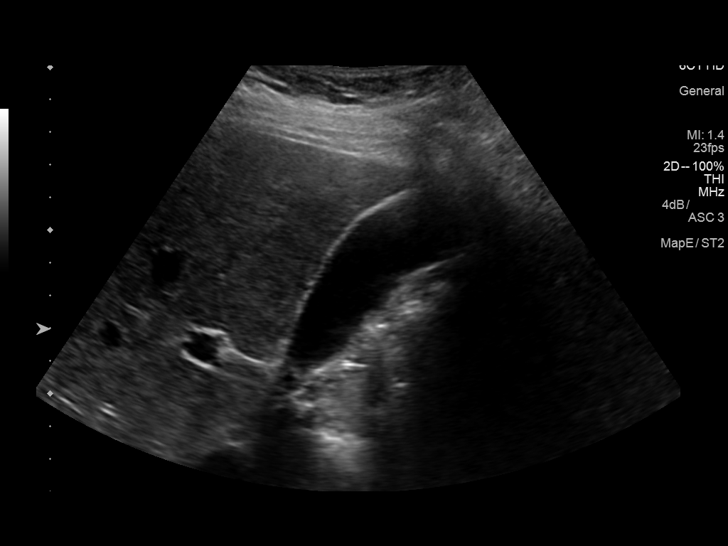
[im 26/153]
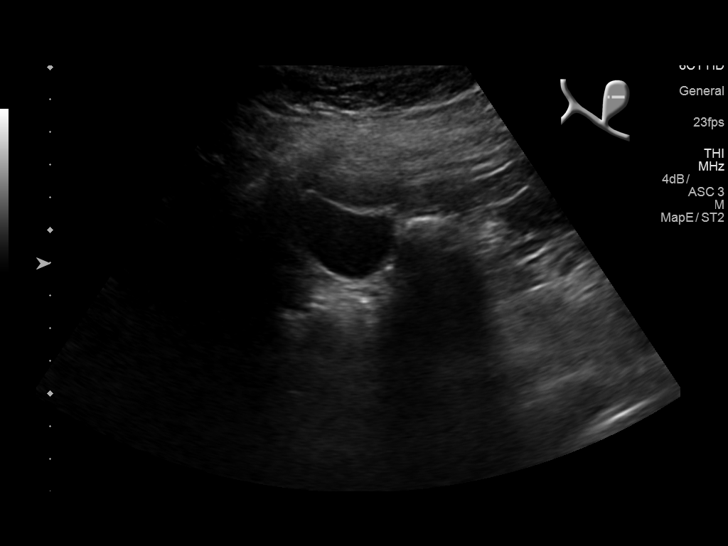
[im 39/153]
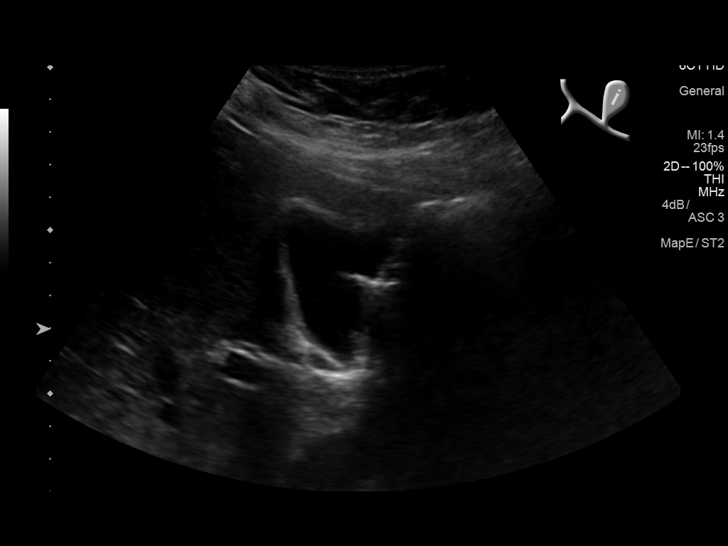
[im 51/153]
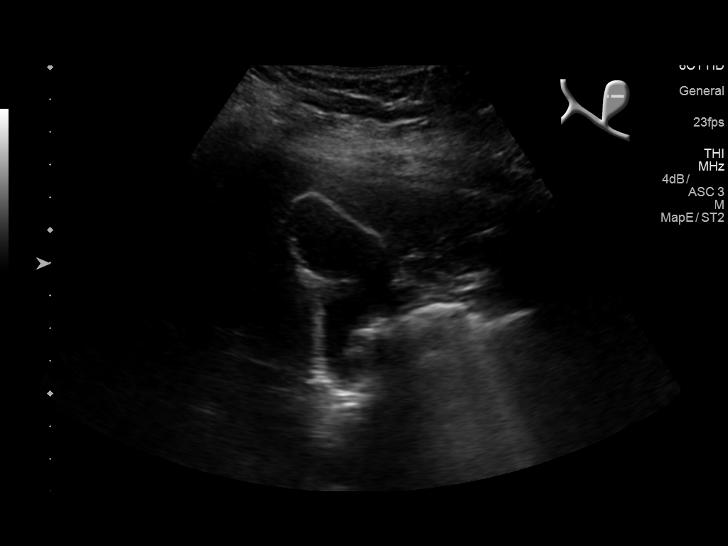
[im 58/153]
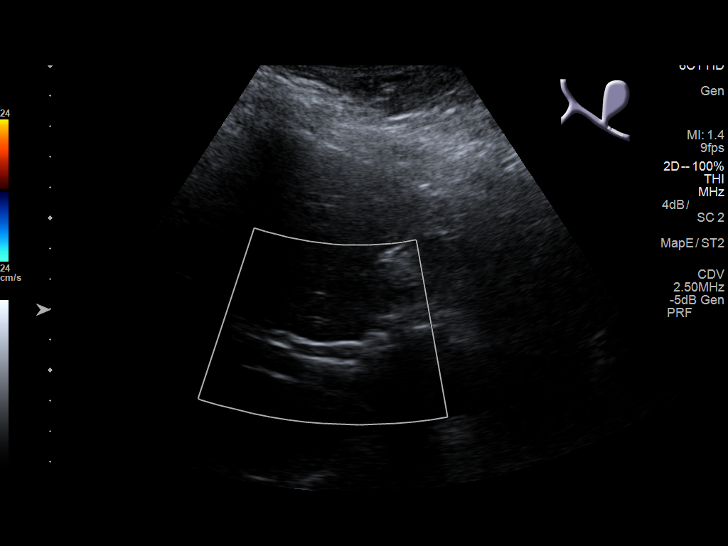
[im 70/153]
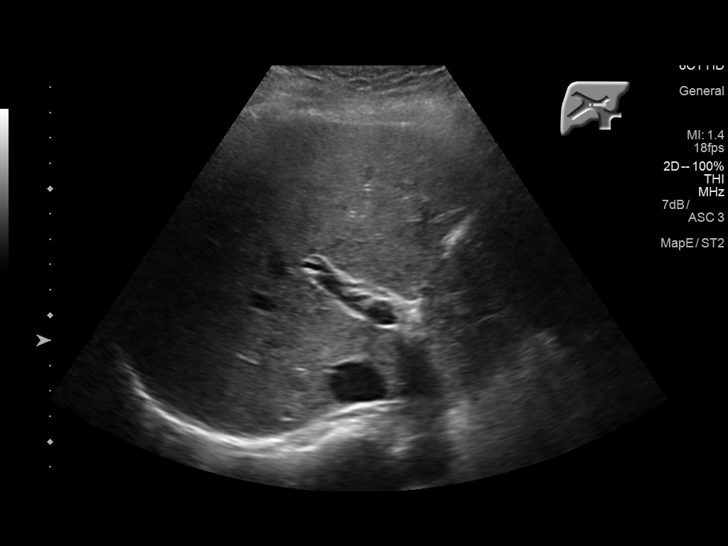
[im 83/153]
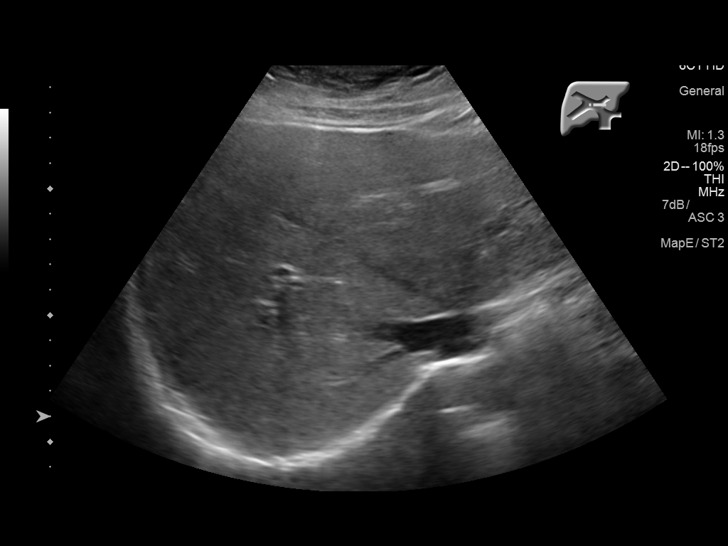
[im 96/153]
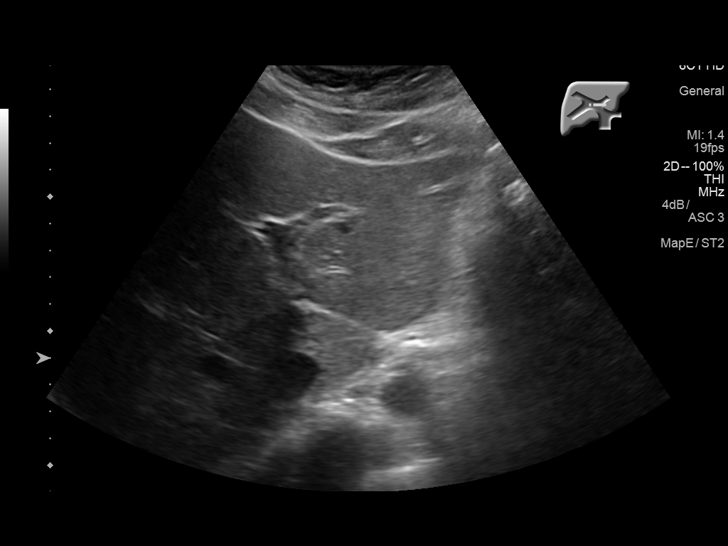
[im 102/153]
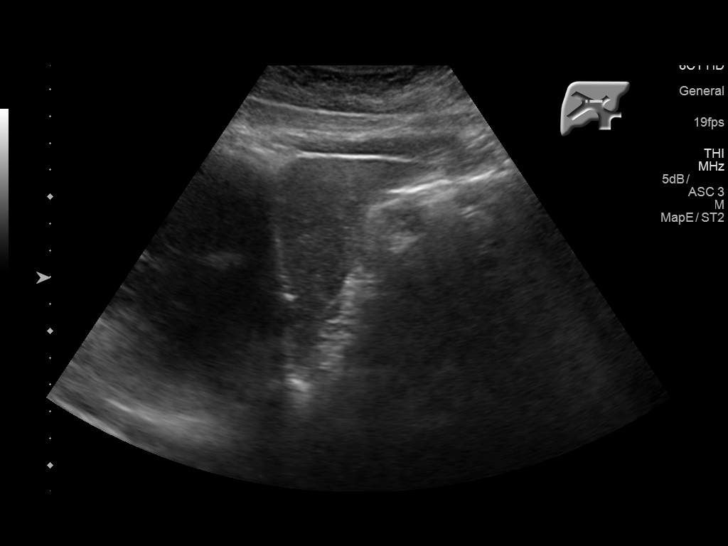
[im 115/153]
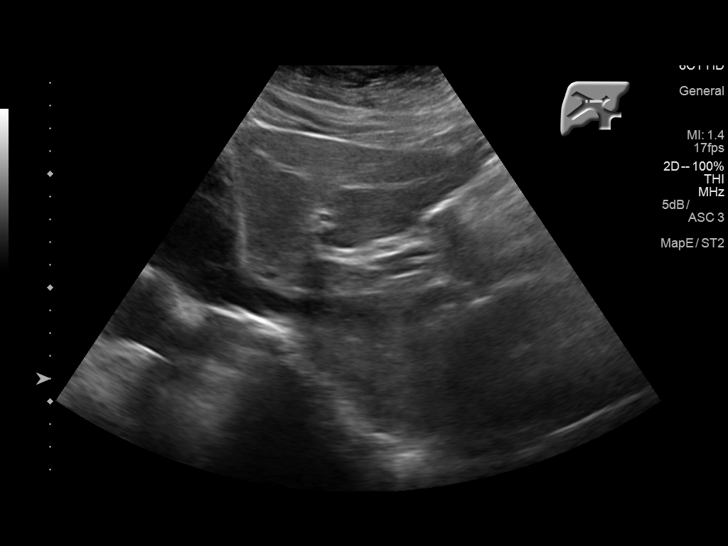
[im 127/153]
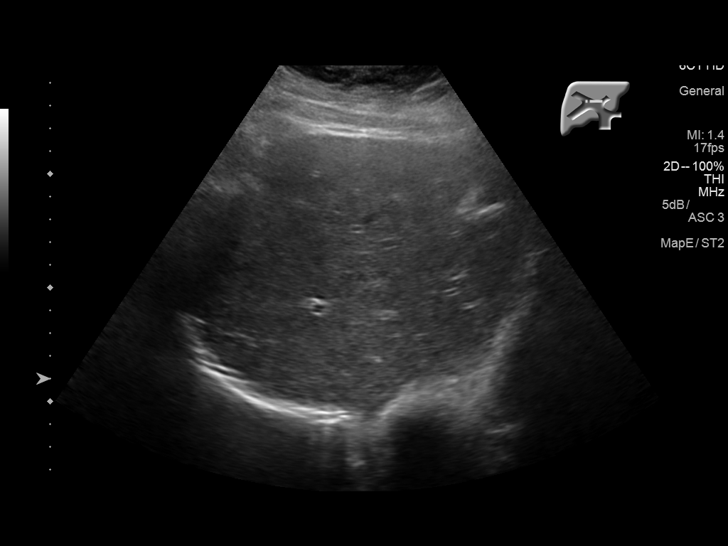
[im 140/153]
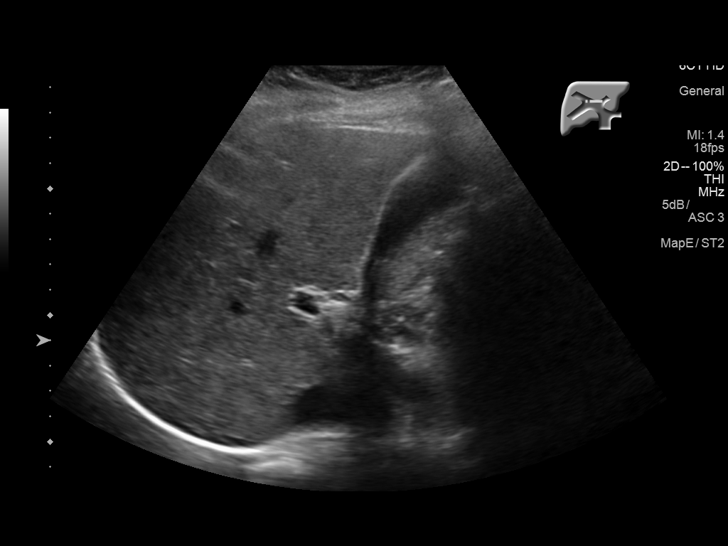
[im 153/153]
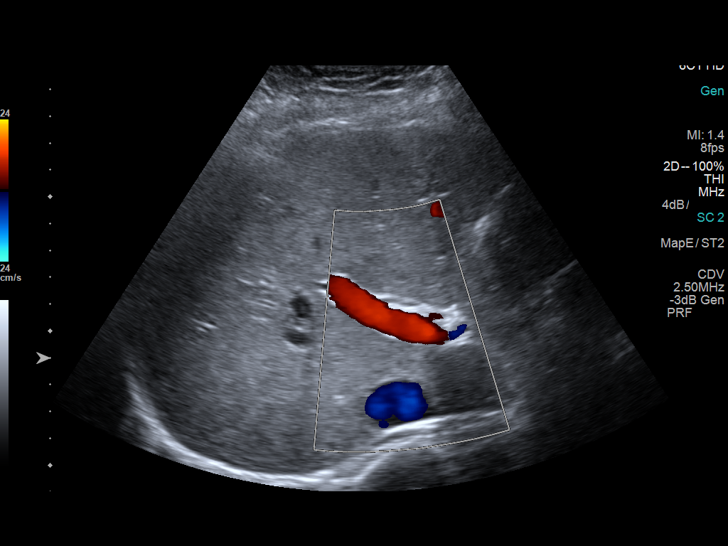

[14 of 25 positions shown; findings below may reference images not displayed]

FINDINGS: Gallbladder:

No gallstones or gallbladder wall thickening. No pericholecystic
fluid. The sonographer reports no sonographic Murphy's sign.

Common bile duct:

Diameter: 4 mm

Liver:

No focal lesion identified. Within normal limits in parenchymal
echogenicity.
IMPRESSION: Unremarkable limited right upper quadrant ultrasound.

## 2019-05-15 DIAGNOSIS — E559 Vitamin D deficiency, unspecified: Secondary | ICD-10-CM | POA: Insufficient documentation

## 2019-05-15 DIAGNOSIS — Z8639 Personal history of other endocrine, nutritional and metabolic disease: Secondary | ICD-10-CM | POA: Insufficient documentation

## 2020-06-04 DIAGNOSIS — Z139 Encounter for screening, unspecified: Secondary | ICD-10-CM

## 2020-06-04 LAB — GLUCOSE, POCT (MANUAL RESULT ENTRY): POC Glucose: 105 mg/dl — AB (ref 70–99)

## 2020-06-04 NOTE — Congregational Nurse Program (Signed)
  Dept: [phone]   Congregational Nurse Program Note  Date of Encounter: 06/04/2020  Past Medical History: Past Medical History:  Diagnosis Date  . Abdominal cramping 06/05/2015  . Dizziness 06/05/2015  . Ear pain 06/05/2015  . Pelvic pain in female 03/12/2016  . Weight gain 06/05/2015    Encounter Details:  CNP Questionnaire - 06/04/20 1435      Questionnaire   Patient Status Refugee    Race Asian    Location Patient Served At [employer]    Uninsured Not Applicable    Food No food insecurities    Housing/Utilities Yes, have permanent housing    Transportation No transportation needs    Interpersonal Safety Yes, feel physically and emotionally safe where you currently live    Medication No medication insecurities    Medical Provider Yes    Referrals Dental    ED Visit Averted Not Applicable    Life-Saving Intervention Made Not Applicable          Client came to community center for health topic discussion and teaching. She also asked for BP and blood sugar check. Blood pressure 121/89 and blood sugar 61. She explained she has lost 80 pounds over 4 years eating better choices and exercizing. She asked for help getting a dental cleaning appointment. CNP made call to clinic. Awaiting callback. CNP will contact client once appointment is set. Orion Crook, RN, BSN, CNP [phone]

## 2020-06-05 ENCOUNTER — Telehealth: Payer: Self-pay

## 2020-06-05 NOTE — Telephone Encounter (Signed)
Called [facility] to help client get name on the list for dental cleaning. Gave name to Dental Department. [facility] will contact client in 2-4 weeks for appointment time. Orion Crook, RN, BSN, CNP [phone]

## 2020-06-05 NOTE — Telephone Encounter (Signed)
Left message that [facility] will call client in 2-4 weeks to give appointment date and time. Orion Crook, RN, BSN, CNP [phone]

## 2020-06-18 NOTE — Congregational Nurse Program (Signed)
  Dept: [phone]   Congregational Nurse Program Note  Date of Encounter: 06/18/2020  Past Medical History: Past Medical History:  Diagnosis Date  . Abdominal cramping 06/05/2015  . Dizziness 06/05/2015  . Ear pain 06/05/2015  . Pelvic pain in female 03/12/2016  . Weight gain 06/05/2015    Encounter Details:  CNP Questionnaire - 06/18/20 1400      Questionnaire   Do you give verbal consent to treat you today? Yes    Visit Setting Other    Location Patient Served At [facility]    Patient Status Refugee    Medical Provider No    Insurance Medicaid    Intervention Advocate;Support;Assess (including screenings)    Referrals PCP - [place]         Client came to [facility] center at [facility] for BP screening (BP 94/64 and pulse 62). She does have dizziness at times and wants to see doctor for overall health checkup. She requested help with understanding medical documents to get appointment at [facility]. Her son saw Dr. Atha Starks and she and her husband want to get  wellness checkups and establish care. CNP assisted client with understanding forms. Client will help her husband fill forms out and then get paperwork back to the [facility]. Orion Crook, RN, BSN, CNP [phone]

## 2020-09-26 ENCOUNTER — Other Ambulatory Visit: Payer: Self-pay

## 2020-09-26 ENCOUNTER — Encounter (HOSPITAL_COMMUNITY): Payer: Self-pay

## 2020-09-26 ENCOUNTER — Ambulatory Visit (HOSPITAL_COMMUNITY)
Admission: EM | Admit: 2020-09-26 | Discharge: 2020-09-26 | Disposition: A | Discharge status: Home / Self Care | Payer: Medicaid Other | Attending: Emergency Medicine | Admitting: Emergency Medicine

## 2020-09-26 DIAGNOSIS — R519 Headache, unspecified: Secondary | ICD-10-CM | POA: Diagnosis not present

## 2020-09-26 DIAGNOSIS — U071 COVID-19: Secondary | ICD-10-CM | POA: Diagnosis not present

## 2020-09-26 DIAGNOSIS — Z20822 Contact with and (suspected) exposure to covid-19: Secondary | ICD-10-CM | POA: Diagnosis present

## 2020-09-26 DIAGNOSIS — R059 Cough, unspecified: Secondary | ICD-10-CM | POA: Diagnosis present

## 2020-09-26 DIAGNOSIS — R0602 Shortness of breath: Secondary | ICD-10-CM | POA: Diagnosis not present

## 2020-09-26 MED ORDER — ALBUTEROL SULFATE HFA 108 (90 BASE) MCG/ACT IN AERS: 1.0000 | INHALATION_SPRAY | Freq: Four times a day (QID) | RESPIRATORY_TRACT | 0 refills | Status: DC | PRN

## 2020-09-26 MED ORDER — PREDNISONE 10 MG (21) PO TBPK: ORAL_TABLET | Freq: Every day | ORAL | 0 refills | Status: DC

## 2020-09-26 MED ORDER — NAPROXEN 500 MG PO TABS: 500.0000 mg | ORAL_TABLET | Freq: Two times a day (BID) | ORAL | 0 refills | Status: DC

## 2020-09-26 NOTE — ED Triage Notes (Signed)
Pt in with c/o headache that started yesterday, pt also c/o sob and nasal congestion. Pt also c/o loss of taste and cough

## 2020-09-26 NOTE — ED Provider Notes (Signed)
[facility]    CSN: [id number] Arrival date & time: 09/26/20  1825      History   Chief Complaint No chief complaint on file.   HPI Darlene Cruz is a 35 y.o. female.   Pt here for sob, cough sore throat, body aches. States her husband has the same symptoms. Pt has a headache intermit concerned that it is covid. interp line used for pt. Pt took tylenol pta minimal relief.      Past Medical History:  Diagnosis Date  . Abdominal cramping 06/05/2015  . Dizziness 06/05/2015  . Ear pain 06/05/2015  . Pelvic pain in female 03/12/2016  . Weight gain 06/05/2015    Patient Active Problem List   Diagnosis Date Noted  . Pelvic pain in female 03/12/2016  . Dizziness 06/05/2015  . Weight gain 06/05/2015  . Ear pain 06/05/2015  . Abdominal cramping 06/05/2015    Past Surgical History:  Procedure Laterality Date  . CESAREAN SECTION    . CESAREAN SECTION    . KNEE SURGERY Left   . TONSILLECTOMY AND ADENOIDECTOMY      OB History   No obstetric history on file.      Home Medications    Prior to Admission medications   Medication Sig Start Date End Date Taking? Authorizing Provider  acetaminophen (TYLENOL) 500 MG tablet Take 2 tablets (1,000 mg total) by mouth every 6 (six) hours as needed for headache. 08/25/17   Lizbeth Bark, FNP  Cholecalciferol (VITAMIN D PO) Take 1 tablet by mouth once a week.    [provider]  HYDROcodone-acetaminophen (NORCO/VICODIN) 5-325 MG tablet Take 1 tablet by mouth every 6 (six) hours as needed for moderate pain.    [provider]  ibuprofen (ADVIL,MOTRIN) 600 MG tablet Take 600 mg by mouth every 6 (six) hours as needed for mild pain or moderate pain.    [provider]  penicillin v potassium (VEETID) 500 MG tablet Take 500 mg by mouth 4 (four) times daily. Until gone    [provider]    Family History Family History  Problem Relation Age of Onset  . Hypertension Mother   .  Diabetes Mother   . Hyperlipidemia Mother     Social History Social History   Tobacco Use  . Smoking status: Former Smoker    Packs/day: 0.20    Years: 5.00    Pack years: 1.00    Types: Cigarettes  . Smokeless tobacco: Never Used  . Tobacco comment: quit July 2016   Substance Use Topics  . Alcohol use: No    Alcohol/week: 0.0 standard drinks  . Drug use: No     Allergies   Patient has no known allergies.   Review of Systems Review of Systems  Constitutional: Positive for fatigue.  HENT: Positive for sore throat.   Eyes: Negative.   Respiratory: Positive for cough and shortness of breath.   Cardiovascular: Negative.   Gastrointestinal: Negative.   Genitourinary: Negative.   Neurological: Positive for headaches.     Physical Exam Triage Vital Signs ED Triage Vitals  Enc Vitals Group     BP      Pulse      Resp      Temp      Temp src      SpO2      Weight      Height      Head Circumference      Peak Flow  Pain Score      Pain Loc      Pain Edu?      Excl. in GC?    No data found.  Updated Vital Signs There were no vitals taken for this visit.  Visual Acuity     Physical Exam Constitutional:      Appearance: She is well-developed.  HENT:     Head: Normocephalic.     Mouth/Throat:     Mouth: Mucous membranes are moist.  Eyes:     Pupils: Pupils are equal, round, and reactive to light.  Cardiovascular:     Rate and Rhythm: Normal rate.  Pulmonary:     Effort: Pulmonary effort is normal.  Abdominal:     Palpations: Abdomen is soft.  Musculoskeletal:        General: Normal range of motion.     Cervical back: Normal range of motion.  Skin:    General: Skin is warm.     Capillary Refill: Capillary refill takes less than 2 seconds.  Neurological:     Mental Status: She is alert.     GCS: GCS eye subscore is 4. GCS verbal subscore is 5. GCS motor subscore is 6.      UC Treatments / Results  Labs (all labs ordered are listed,  but only abnormal results are displayed) Labs Reviewed - No data to display  EKG   Radiology No results found.  Procedures Procedures (including critical care time)  Medications Ordered in UC Medications - No data to display  Initial Impression / Assessment and Plan / UC Course  I have reviewed the triage vital signs and the nursing notes.  Pertinent labs & imaging results that were available during my care of the patient were reviewed by me and considered in my medical decision making (see chart for details).     interp line used  Expressed that pt may have covid symptoms  Will send off test check my chart for results  Will give meds to help reduce symptoms but will not eliminate them.  Will need to quarantine for 5 days  If symtpoms become worse go to the ER   Final Clinical Impressions(s) / UC Diagnoses   Final diagnoses:  Cough  Shortness of breath   Discharge Instructions   None    ED Prescriptions    None     PDMP not reviewed this encounter.   Coralyn Mark, NP 09/26/20 1931

## 2020-09-27 LAB — SARS CORONAVIRUS 2 (TAT 6-24 HRS): SARS Coronavirus 2: NEGATIVE

## 2020-10-01 ENCOUNTER — Other Ambulatory Visit: Payer: Self-pay

## 2020-10-01 ENCOUNTER — Emergency Department (HOSPITAL_COMMUNITY)
Admission: EM | Admit: 2020-10-01 | Discharge: 2020-10-02 | Disposition: A | Discharge status: Home / Self Care | Payer: Medicaid Other | Attending: Emergency Medicine | Admitting: Emergency Medicine

## 2020-10-01 ENCOUNTER — Encounter (HOSPITAL_COMMUNITY): Payer: Self-pay

## 2020-10-01 DIAGNOSIS — R519 Headache, unspecified: Secondary | ICD-10-CM | POA: Insufficient documentation

## 2020-10-01 DIAGNOSIS — M791 Myalgia, unspecified site: Secondary | ICD-10-CM | POA: Diagnosis present

## 2020-10-01 DIAGNOSIS — R5383 Other fatigue: Secondary | ICD-10-CM | POA: Insufficient documentation

## 2020-10-01 DIAGNOSIS — Z5321 Procedure and treatment not carried out due to patient leaving prior to being seen by health care provider: Secondary | ICD-10-CM | POA: Insufficient documentation

## 2020-10-01 NOTE — ED Notes (Signed)
Patient states her son is home alone and she cant wait any longer to see a doctor

## 2020-10-01 NOTE — ED Triage Notes (Signed)
Patient complains of ongoing body aches with headache and fatigue. Seen on 1/13 at [facility] for same. NAD

## 2020-10-04 ENCOUNTER — Ambulatory Visit (INDEPENDENT_AMBULATORY_CARE_PROVIDER_SITE_OTHER): Payer: Medicaid Other

## 2020-10-04 ENCOUNTER — Encounter (HOSPITAL_COMMUNITY): Payer: Self-pay

## 2020-10-04 ENCOUNTER — Ambulatory Visit (HOSPITAL_COMMUNITY)
Admission: EM | Admit: 2020-10-04 | Discharge: 2020-10-04 | Disposition: A | Discharge status: Home / Self Care | Payer: Medicaid Other | Attending: Emergency Medicine | Admitting: Emergency Medicine

## 2020-10-04 ENCOUNTER — Other Ambulatory Visit: Payer: Self-pay

## 2020-10-04 DIAGNOSIS — R059 Cough, unspecified: Secondary | ICD-10-CM

## 2020-10-04 DIAGNOSIS — U071 COVID-19: Secondary | ICD-10-CM | POA: Diagnosis not present

## 2020-10-04 DIAGNOSIS — J069 Acute upper respiratory infection, unspecified: Secondary | ICD-10-CM | POA: Insufficient documentation

## 2020-10-04 DIAGNOSIS — R52 Pain, unspecified: Secondary | ICD-10-CM | POA: Diagnosis present

## 2020-10-04 DIAGNOSIS — Z87891 Personal history of nicotine dependence: Secondary | ICD-10-CM | POA: Insufficient documentation

## 2020-10-04 DIAGNOSIS — R519 Headache, unspecified: Secondary | ICD-10-CM

## 2020-10-04 MED ORDER — BENZONATATE 100 MG PO CAPS: 100.0000 mg | ORAL_CAPSULE | Freq: Three times a day (TID) | ORAL | 0 refills | Status: DC | PRN

## 2020-10-04 MED ORDER — KETOROLAC TROMETHAMINE 30 MG/ML IJ SOLN
INTRAMUSCULAR | Status: AC
  Filled 2020-10-04: qty 1

## 2020-10-04 MED ORDER — KETOROLAC TROMETHAMINE 30 MG/ML IJ SOLN
30.0000 mg | Freq: Once | INTRAMUSCULAR | Status: AC
  Administered 2020-10-04: 30 mg via INTRAMUSCULAR

## 2020-10-04 MED ORDER — PROMETHAZINE-DM 6.25-15 MG/5ML PO SYRP: 5.0000 mL | ORAL_SOLUTION | Freq: Four times a day (QID) | ORAL | 0 refills | Status: DC | PRN

## 2020-10-04 NOTE — ED Provider Notes (Signed)
[facility]    CSN: [id number] Arrival date & time: 10/04/20  1825      History   Chief Complaint Chief Complaint  Patient presents with  . Headache  . Cough  . Generalized Body Aches    HPI Darlene Cruz is a 35 y.o. female.   Darlene Cruz presents with complaints of  Persistent, Headache as well as cough which has been worsening. She has been taking prednisone, naproxen, delsym, and cough drops which haven't helped. Her husband and son tested positive for covid, she tested negative, however. Symptoms started around 1/13,  she was seen here. No gi symptoms. No known fevers. No history of asthma.   Arabic video interpreter used to collect history and physical exam.     ROS per HPI, negative if not otherwise mentioned.      Past Medical History:  Diagnosis Date  . Abdominal cramping 06/05/2015  . Dizziness 06/05/2015  . Ear pain 06/05/2015  . Pelvic pain in female 03/12/2016  . Weight gain 06/05/2015    Patient Active Problem List   Diagnosis Date Noted  . Pelvic pain in female 03/12/2016  . Dizziness 06/05/2015  . Weight gain 06/05/2015  . Ear pain 06/05/2015  . Abdominal cramping 06/05/2015    Past Surgical History:  Procedure Laterality Date  . CESAREAN SECTION    . CESAREAN SECTION    . KNEE SURGERY Left   . TONSILLECTOMY AND ADENOIDECTOMY      OB History   No obstetric history on file.      Home Medications    Prior to Admission medications   Medication Sig Start Date End Date Taking? Authorizing Provider  benzonatate (TESSALON) 100 MG capsule Take 1-2 capsules (100-200 mg total) by mouth 3 (three) times daily as needed for cough. 10/04/20  Yes Larenz Frasier, Dorene Grebe B, NP  promethazine-dextromethorphan (PROMETHAZINE-DM) 6.25-15 MG/5ML syrup Take 5 mLs by mouth 4 (four) times daily as needed for cough. 10/04/20  Yes Linus Mako B, NP  acetaminophen (TYLENOL) 500 MG tablet Take 2 tablets (1,000 mg total) by mouth every 6 (six) hours as  needed for headache. 08/25/17   Lizbeth Bark, FNP  albuterol (VENTOLIN HFA) 108 (90 Base) MCG/ACT inhaler Inhale 1-2 puffs into the lungs every 6 (six) hours as needed for wheezing or shortness of breath. 09/26/20   Coralyn Mark, NP  Cholecalciferol (VITAMIN D PO) Take 1 tablet by mouth once a week.    [provider]  HYDROcodone-acetaminophen (NORCO/VICODIN) 5-325 MG tablet Take 1 tablet by mouth every 6 (six) hours as needed for moderate pain.    [provider]  ibuprofen (ADVIL,MOTRIN) 600 MG tablet Take 600 mg by mouth every 6 (six) hours as needed for mild pain or moderate pain.    [provider]  naproxen (NAPROSYN) 500 MG tablet Take 1 tablet (500 mg total) by mouth 2 (two) times daily. 09/26/20   Coralyn Mark, NP  penicillin v potassium (VEETID) 500 MG tablet Take 500 mg by mouth 4 (four) times daily. Until gone    [provider]  predniSONE (STERAPRED UNI-PAK 21 TAB) 10 MG (21) TBPK tablet Take by mouth daily. Take 6 tabs by mouth daily  for 2 days, then 5 tabs for 2 days, then 4 tabs for 2 days, then 3 tabs for 2 days, 2 tabs for 2 days, then 1 tab by mouth daily for 2 days 09/26/20   Coralyn Mark, NP    Family  History Family History  Problem Relation Age of Onset  . Hypertension Mother   . Diabetes Mother   . Hyperlipidemia Mother     Social History Social History   Tobacco Use  . Smoking status: Former Smoker    Packs/day: 0.20    Years: 5.00    Pack years: 1.00    Types: Cigarettes  . Smokeless tobacco: Never Used  . Tobacco comment: quit July 2016   Substance Use Topics  . Alcohol use: No    Alcohol/week: 0.0 standard drinks  . Drug use: No     Allergies   Patient has no known allergies.   Review of Systems Review of Systems   Physical Exam Triage Vital Signs ED Triage Vitals  Enc Vitals Group     BP 10/04/20 1843 (!) 109/58     Pulse Rate 10/04/20 1843 64     Resp 10/04/20 1843 17      Temp 10/04/20 1843 98.1 F (36.7 C)     Temp src --      SpO2 10/04/20 1843 97 %     Weight --      Height --      Head Circumference --      Peak Flow --      Pain Score 10/04/20 1841 10     Pain Loc --      Pain Edu? --      Excl. in GC? --    No data found.  Updated Vital Signs BP (!) 109/58   Pulse 64   Temp 98.1 F (36.7 C)   Resp 17   LMP 09/11/2020 (Exact Date)   SpO2 97%   Visual Acuity Right Eye Distance:   Left Eye Distance:   Bilateral Distance:    Right Eye Near:   Left Eye Near:    Bilateral Near:     Physical Exam Constitutional:      General: She is not in acute distress.    Appearance: She is well-developed.  HENT:     Head: Normocephalic.     Mouth/Throat:     Mouth: Mucous membranes are moist.  Eyes:     Extraocular Movements: Extraocular movements intact.     Pupils: Pupils are equal, round, and reactive to light.  Cardiovascular:     Rate and Rhythm: Normal rate.  Pulmonary:     Effort: Pulmonary effort is normal. No respiratory distress.     Breath sounds: Normal breath sounds. No wheezing.     Comments: No cough throughout exam  Skin:    General: Skin is warm and dry.  Neurological:     Mental Status: She is alert and oriented to person, place, and time.     Cranial Nerves: No cranial nerve deficit or facial asymmetry.      UC Treatments / Results  Labs (all labs ordered are listed, but only abnormal results are displayed) Labs Reviewed  SARS CORONAVIRUS 2 (TAT 6-24 HRS)    EKG   Radiology DG Chest 2 View  Result Date: 10/04/2020 CLINICAL DATA:  Cough for 1 week EXAM: CHEST - 2 VIEW COMPARISON:  None. FINDINGS: Cardiac shadow is at the upper limits of normal in size. The lungs are clear bilaterally. No effusion is noted. No bony abnormality is seen. Metallic foreign bodies are identified which appear to be extrinsic to the patient. Clinical correlation is recommended. IMPRESSION: No acute intrathoracic abnormality noted.  Metallic foreign bodies which appear to be extrinsic to the patient.  Electronically Signed   By: Alcide Clever M.D.   On: 10/04/2020 19:09    Procedures Procedures (including critical care time)  Medications Ordered in UC Medications  ketorolac (TORADOL) 30 MG/ML injection 30 mg (30 mg Intramuscular Given 10/04/20 1932)    Initial Impression / Assessment and Plan / UC Course  I have reviewed the triage vital signs and the nursing notes.  Pertinent labs & imaging results that were available during my care of the patient were reviewed by me and considered in my medical decision making (see chart for details).     Non toxic. Benign physical exam.  No neurological findings on exam. Chest xray without acute findings. Suspect likely false negative covid testing last visit given her family exposure and her complaints today. History and physical consistent with viral illness.  Patient requests repeat testing today. Supportive cares recommended. Return precautions provided. Patient verbalized understanding and agreeable to plan.   Final Clinical Impressions(s) / UC Diagnoses   Final diagnoses:  Cough  Upper respiratory tract infection, unspecified type  Bad headache     Discharge Instructions     I do feel this is consistent with covid-19, despite your negative test.  Continue with previously prescribed medications as needed.  Tessalon as needed for cough.  Cough syrup at night time mainly, as it can cause drowsiness.  Your chest xray is normal tonight.  Follow up with the post-covid care clinic as needed. Go to the ER if worsening.    ED Prescriptions    Medication Sig Dispense Auth. Provider   benzonatate (TESSALON) 100 MG capsule Take 1-2 capsules (100-200 mg total) by mouth 3 (three) times daily as needed for cough. 21 capsule Linus Mako B, NP   promethazine-dextromethorphan (PROMETHAZINE-DM) 6.25-15 MG/5ML syrup Take 5 mLs by mouth 4 (four) times daily as needed for cough. 118  mL Linus Mako B, NP     PDMP not reviewed this encounter.   Georgetta Haber, NP 10/06/20 1003

## 2020-10-04 NOTE — Discharge Instructions (Addendum)
I do feel this is consistent with covid-19, despite your negative test.  Continue with previously prescribed medications as needed.  Tessalon as needed for cough.  Cough syrup at night time mainly, as it can cause drowsiness.  Your chest xray is normal tonight.  Follow up with the post-covid care clinic as needed. Go to the ER if worsening.

## 2020-10-04 NOTE — ED Triage Notes (Addendum)
Pt in with c/o cough and headache and body aches that have been going on for over 1 week. Pt was seen on 1/13 and 1/18 for same sxs. Husband is covid positive +  Pt has been taking prednisone, naproxen and albuterol with no relief from sxs Pt has also been taking dayquil and halls cough drops

## 2020-10-05 LAB — SARS CORONAVIRUS 2 (TAT 6-24 HRS): SARS Coronavirus 2: POSITIVE — AB

## 2020-10-07 ENCOUNTER — Encounter: Payer: Self-pay | Admitting: Physician Assistant

## 2020-10-07 ENCOUNTER — Telehealth: Payer: Self-pay | Admitting: Physician Assistant

## 2020-10-07 NOTE — Telephone Encounter (Signed)
Called to discuss with Darlene Cruz about Covid symptoms and the use of sotrovimab, remdisivir or oral therapies for those with mild to moderate Covid symptoms and at a high risk of hospitalization.    Pt does not qualify as pt's symptoms first presented > 10 days prior to timing of infusion. Symptoms tier reviewed as well as criteria for ending isolation. Preventative practices reviewed. Patient verbalized understanding   Patient Active Problem List   Diagnosis Date Noted  . Morbid obesity (HCC)   . Pelvic pain in female 03/12/2016  . Dizziness 06/05/2015  . Weight gain 06/05/2015  . Ear pain 06/05/2015  . Abdominal cramping 06/05/2015    Cline Crock PA-C

## 2020-12-09 ENCOUNTER — Encounter: Payer: Self-pay | Admitting: *Deleted

## 2021-01-15 ENCOUNTER — Other Ambulatory Visit: Payer: Self-pay

## 2021-01-15 ENCOUNTER — Encounter: Payer: Self-pay | Admitting: Certified Nurse Midwife

## 2021-01-15 ENCOUNTER — Other Ambulatory Visit (HOSPITAL_COMMUNITY)
Admission: RE | Admit: 2021-01-15 | Discharge: 2021-01-15 | Disposition: A | Discharge status: Home / Self Care | Payer: Medicaid Other | Source: Ambulatory Visit | Attending: Obstetrics and Gynecology | Admitting: Obstetrics and Gynecology

## 2021-01-15 ENCOUNTER — Ambulatory Visit (INDEPENDENT_AMBULATORY_CARE_PROVIDER_SITE_OTHER): Payer: Medicaid Other | Admitting: Certified Nurse Midwife

## 2021-01-15 VITALS — BP 94/63 | HR 60 | Wt 274.5 lb

## 2021-01-15 DIAGNOSIS — Z113 Encounter for screening for infections with a predominantly sexual mode of transmission: Secondary | ICD-10-CM | POA: Diagnosis not present

## 2021-01-15 DIAGNOSIS — Z01419 Encounter for gynecological examination (general) (routine) without abnormal findings: Secondary | ICD-10-CM | POA: Diagnosis not present

## 2021-01-15 NOTE — Progress Notes (Signed)
GYNECOLOGY CLINIC ANNUAL PREVENTATIVE CARE ENCOUNTER NOTE  Subjective:   Darlene Cruz is a 35 y.o. [phone] female here for a routine annual gynecologic exam.  Current complaints: desires fertility, have been trying since 2014 and been unable to get pregnant after a history of 5 miscarriages with one live birth (CS in [place] due to macrosomia). Denies abnormal vaginal bleeding, discharge, pelvic pain, problems with intercourse or other gynecologic concerns. Having regular cycles q28-33 days. Has cramping with periods but not excessive bleeding. When asked about past testing, pt stated her husband was told he has low sperm motility and that her IUFD at 12mo was due to "weak sperm."  Arabic interpreter Buford Dresser) present for interpretation services.  Gynecologic History Patient's last menstrual period was 12/17/2020. Contraception: none Last Pap: unknown.  Last mammogram:  N/A  Obstetric History OB History  Gravida Para Term Preterm AB Living  6 1 1  0 5 1  SAB IAB Ectopic Multiple Live Births  5 0 0 0 1    # Outcome Date GA Lbr Len/2nd Weight Sex Delivery Anes PTL Lv  6 SAB 2014          5 SAB 2012          4 Term 10/01/08     CS-LTranv     3 SAB 2008          2 SAB 2008          1 SAB 2008            Past Medical History:  Diagnosis Date  . Abdominal cramping 06/05/2015  . Dizziness 06/05/2015  . Ear pain 06/05/2015  . Morbid obesity (HCC)   . Pelvic pain in female 03/12/2016  . Weight gain 06/05/2015    Past Surgical History:  Procedure Laterality Date  . CESAREAN SECTION    . CESAREAN SECTION    . KNEE SURGERY Left   . TONSILLECTOMY AND ADENOIDECTOMY      Current Outpatient Medications on File Prior to Visit  Medication Sig Dispense Refill  . acetaminophen (TYLENOL) 500 MG tablet Take 2 tablets (1,000 mg total) by mouth every 6 (six) hours as needed for headache. 30 tablet 0  . Cholecalciferol (VITAMIN D PO) Take 1 tablet by mouth once a week. Every 5 days     No  current facility-administered medications on file prior to visit.    No Known Allergies  Social History   Socioeconomic History  . Marital status: Married    Spouse name: Not on file  . Number of children: Not on file  . Years of education: Not on file  . Highest education level: Not on file  Occupational History  . Not on file  Tobacco Use  . Smoking status: Former Smoker    Packs/day: 0.20    Years: 5.00    Pack years: 1.00    Types: Cigarettes  . Smokeless tobacco: Never Used  . Tobacco comment: quit July 2016   Substance and Sexual Activity  . Alcohol use: No    Alcohol/week: 0.0 standard drinks  . Drug use: No  . Sexual activity: Yes  Other Topics Concern  . Not on file  Social History Narrative   Level of education: High school    Transportation: bus    Housing situation: apt   Relationships (safe): yes   Social Determinants of August 2016 Strain: Not on file  Food Insecurity: No Food Insecurity  . Worried About Running  Out of Food in the Last Year: Never true  . Ran Out of Food in the Last Year: Never true  Transportation Needs: No Transportation Needs  . Lack of Transportation (Medical): No  . Lack of Transportation (Non-Medical): No  Physical Activity: Not on file  Stress: Not on file  Social Connections: Not on file  Intimate Partner Violence: Not on file   Family History  Problem Relation Age of Onset  . Hypertension Mother   . Diabetes Mother   . Hyperlipidemia Mother    The following portions of the patient's history were reviewed and updated as appropriate: allergies, current medications, past family history, past medical history, past social history, past surgical history and problem list.  Review of Systems Pertinent items noted in HPI and remainder of comprehensive ROS otherwise negative.   Objective:  BP 94/63   Pulse 60   Wt 274 lb 8 oz (124.5 kg)   LMP 12/17/2020   BMI 47.12 kg/m  CONSTITUTIONAL: Well-developed,  well-nourished female in no acute distress.  HENT:  Normocephalic, atraumatic, External right and left ear normal. Oropharynx is clear and moist EYES: Conjunctivae and EOM are normal. Pupils are equal, round, and reactive to light. No scleral icterus.  NECK: Normal range of motion, supple, no masses.  Normal thyroid.  SKIN: Skin is warm and dry. No rash noted. Not diaphoretic. No erythema. No pallor. NEUROLGIC: Alert and oriented to person, place, and time. Normal reflexes, muscle tone coordination. No cranial nerve deficit noted. PSYCHIATRIC: Normal mood and affect. Normal behavior. Normal judgment and thought content. CARDIOVASCULAR: Normal heart rate noted, regular rhythm RESPIRATORY: Clear to auscultation bilaterally. Effort and breath sounds normal, no problems with respiration noted. BREASTS: Symmetric in size. No masses, skin changes, nipple drainage, or lymphadenopathy. ABDOMEN: Soft, normal bowel sounds, no distention noted.  No tenderness, rebound or guarding.  PELVIC: Normal appearing external genitalia; normal appearing vaginal mucosa and cervix.  No abnormal discharge noted.  Pap smear obtained.  Normal uterine size, no other palpable masses, no uterine or adnexal tenderness. MUSCULOSKELETAL: Normal range of motion. No tenderness.  No cyanosis, clubbing, or edema.  2+ distal pulses.  Assessment & Plan:  Annual gynecologic examination with pap smear - will follow results and manage accordingly - mammogram not indicated - routine preventative health measures emphasized History of recurrent miscarriage, not currently pregnant - referrals to geneticist and reproductive endocrinology made - APLA, A1C, thyroid panel, Von Willebrand and hgb electrophoresis ordered - urology info given to patient for FOB fertility testing  Follow up in one year for annual exam or as needed  Edd Arbour, CNM, MSN, [employer] Certified Nurse Midwife, [facility]

## 2021-01-15 NOTE — Patient Instructions (Signed)
[facility] for female fertility testing [address] [place], [place] [address] [phone]  Ask for Dr. Vernie Ammons

## 2021-01-16 LAB — CYTOLOGY - PAP
Adequacy: ABSENT
Chlamydia: NEGATIVE
Comment: NEGATIVE
Comment: NEGATIVE
Comment: NEGATIVE
Comment: NORMAL
Diagnosis: NEGATIVE
High risk HPV: NEGATIVE
Neisseria Gonorrhea: NEGATIVE
Trichomonas: NEGATIVE

## 2021-01-17 LAB — VON WILLEBRAND PANEL
Factor VIII Activity: 145 % — ABNORMAL HIGH (ref 56–140)
Von Willebrand Ag: 144 % (ref 50–200)
Von Willebrand Factor: 91 % (ref 50–200)

## 2021-01-17 LAB — CBC
Hematocrit: 36.3 % (ref 34.0–46.6)
Hemoglobin: 11.6 g/dL (ref 11.1–15.9)
MCH: 26.9 pg (ref 26.6–33.0)
MCHC: 32 g/dL (ref 31.5–35.7)
MCV: 84 fL (ref 79–97)
Platelets: 256 10*3/uL (ref 150–450)
RBC: 4.32 x10E6/uL (ref 3.77–5.28)
RDW: 13.5 % (ref 11.7–15.4)
WBC: 7.2 10*3/uL (ref 3.4–10.8)

## 2021-01-17 LAB — HEMOGLOBIN A1C
Est. average glucose Bld gHb Est-mCnc: 103 mg/dL
Hgb A1c MFr Bld: 5.2 % (ref 4.8–5.6)

## 2021-01-17 LAB — HGB FRACTIONATION CASCADE
Hgb A2: 2.1 % (ref 1.8–3.2)
Hgb A: 97.9 % (ref 96.4–98.8)
Hgb F: 0 % (ref 0.0–2.0)
Hgb S: 0 %

## 2021-01-17 LAB — THYROID PANEL WITH TSH
Free Thyroxine Index: 2.5 (ref 1.2–4.9)
T3 Uptake Ratio: 26 % (ref 24–39)
T4, Total: 9.5 ug/dL (ref 4.5–12.0)
TSH: 3.88 u[IU]/mL (ref 0.450–4.500)

## 2021-01-17 LAB — RPR: RPR Ser Ql: NONREACTIVE

## 2021-01-17 LAB — HEPATITIS B SURFACE ANTIGEN: Hepatitis B Surface Ag: NEGATIVE

## 2021-01-17 LAB — COAG STUDIES INTERP REPORT

## 2021-01-17 LAB — HEPATITIS C ANTIBODY: Hep C Virus Ab: <0.1 s/co ratio (ref 0.0–0.9)

## 2021-01-17 LAB — HIV ANTIBODY (ROUTINE TESTING W REFLEX): HIV Screen 4th Generation wRfx: NONREACTIVE

## 2021-02-06 LAB — ANTIPHOSPHOLIPID SYNDROME COMP
APTT: 29.1 s
Anticardiolipin Ab, IgA: <10 [APL'U]
Anticardiolipin Ab, IgG: <10 [GPL'U]
Anticardiolipin Ab, IgM: <10 [MPL'U]
Antiphosphatidylserine IgG: 2 {GPS'U}
Antiphosphatidylserine IgM: 6 {MPS'U}
Antiprothrombin Antibody, IgG: 5 G units
Beta-2 Glycoprotein I, IgA: <10 SAU
Beta-2 Glycoprotein I, IgG: <10 SGU
Beta-2 Glycoprotein I, IgM: <10 SMU
DRVVT Screen Seconds: 30.9 s
Hexagonal Phospholipid Neutral: 0 s
Platelet Neutralization: 0 s

## 2021-02-25 ENCOUNTER — Other Ambulatory Visit: Payer: Self-pay | Admitting: Family Medicine

## 2021-02-25 ENCOUNTER — Other Ambulatory Visit: Payer: Self-pay

## 2021-02-25 ENCOUNTER — Ambulatory Visit (INDEPENDENT_AMBULATORY_CARE_PROVIDER_SITE_OTHER): Payer: Medicaid Other | Admitting: *Deleted

## 2021-02-25 VITALS — BP 99/58 | HR 60 | Temp 98.4°F | Ht 63.0 in | Wt 269.9 lb

## 2021-02-25 DIAGNOSIS — Z3200 Encounter for pregnancy test, result unknown: Secondary | ICD-10-CM

## 2021-02-25 DIAGNOSIS — R319 Hematuria, unspecified: Secondary | ICD-10-CM

## 2021-02-25 DIAGNOSIS — Z3202 Encounter for pregnancy test, result negative: Secondary | ICD-10-CM

## 2021-02-25 DIAGNOSIS — R300 Dysuria: Secondary | ICD-10-CM

## 2021-02-25 LAB — POCT URINALYSIS DIP (DEVICE)
Bilirubin Urine: NEGATIVE
Glucose, UA: NEGATIVE mg/dL
Ketones, ur: NEGATIVE mg/dL
Nitrite: NEGATIVE
Protein, ur: NEGATIVE mg/dL
Specific Gravity, Urine: 1.01 (ref 1.005–1.030)
Urobilinogen, UA: 0.2 mg/dL (ref 0.0–1.0)
pH: 5 (ref 5.0–8.0)

## 2021-02-25 LAB — POCT PREGNANCY, URINE: Preg Test, Ur: NEGATIVE

## 2021-02-25 MED ORDER — NITROFURANTOIN MONOHYD MACRO 100 MG PO CAPS: 100.0000 mg | ORAL_CAPSULE | Freq: Two times a day (BID) | ORAL | 0 refills | Status: DC

## 2021-02-25 MED ORDER — PHENAZOPYRIDINE HCL 200 MG PO TABS: 200.0000 mg | ORAL_TABLET | Freq: Three times a day (TID) | ORAL | 0 refills | Status: DC | PRN

## 2021-02-25 NOTE — Progress Notes (Signed)
Chart reviewed for nurse visit. Agree with plan of care.   Venora Maples, MD 02/25/21 4:48 PM

## 2021-02-25 NOTE — Progress Notes (Signed)
Here for pregnancy test which was negative.I informed Darlene Cruz with interpreter..  She also c/o period 10 days late.c/o Is trying to get pregnant with hx 5 miscarriages.  C/o never got lab results from last visit on 01/15/21 . Reviewed results with her, normal except von Willebrand slight elevated, coag studies elevated. Explained will send message to provider to either contact patient or give [place] information to explain results and recommendations.   Also states husband went to [facility] but states they wouldn't accept their insurance. We discussed medicaid either doesn't cover infertility. They can pay out of pocket for female testing.  She states she hasn't heard from infertility -Dr. April Manson yet. I explained referral was sent and we will follow up with them and contact her. She c/o periodl is 10 days late, c/o pelvic pain/ lower back pain.worse at night and in morning.  Wants to do blood test for pregnancy. Advised we can do serum pregnancy test. Advised may take tylenol or ibuprofen as needed.If severe to go to hospital .she request [place] . I explained I can order that and we do not do in office. She will need to see provider first to evaluated what she needs.  Will make appt at checkout to see provider for pelvic pain.  Also will do ua today to rule out UTI as source of pain. UA + blood, will send for culture. States does have pain with urination.Will send in RX for macrobid and pyridium per protocol. Advised will call if results positive and may need to change medicine.   Advised to go to hospital or urgent care if severe pain or feels needs to be seen before appointment. She voices understanding.  Visit lengthy as patient had many questions and attempted to answer all.  Darlene Driskill,RN

## 2021-02-26 ENCOUNTER — Telehealth: Payer: Self-pay | Admitting: Lactation Services

## 2021-02-26 ENCOUNTER — Telehealth: Payer: Self-pay | Admitting: *Deleted

## 2021-02-26 LAB — BETA HCG QUANT (REF LAB): hCG Quant: <1 m[IU]/mL

## 2021-02-26 NOTE — Telephone Encounter (Signed)
Called patient with assistance of [employer] [place], [place] # [phone]. Patient informed her that her Hcg is 0 and she is not pregnant. Patient reports she has no questions or concerns at this time and will call as needed.

## 2021-02-26 NOTE — Telephone Encounter (Signed)
I called Darlene Cruz with [employer] [phone]. I explained I am calling her back with the information she requested yesterday during her nurse visit. She states someone called her to tell her pregnancy blood test was negative.  I informed her that I called [facility] regarding the Infertility referral and they did receive the referral and left a message for patient to call them back. They state she did not call them back. I informed [name] of that and gave her name and phone number to call: [phone] and ask for Jon Gills , Elaine patient coordinator . I explained her insurance may not cover and they will explain how much she will have to pay, etc. I also informed her per [name], CNM von Willebrand tests, coag studies basically normal and infertility doctor can explain in more detail. I also asked if she is taking the medicine for UTI and she said she is and her pain is improving. She asked if she needs to keep the appt she scheduled for pain on 03/20/21 . I informed her that was because of her pain and that if the treatment for UTI relieves her pain she does not have to keep the appointment; it is her choice. She voices understanding and states she will wait and see it it gets better. [name],RN

## 2021-02-26 NOTE — Telephone Encounter (Signed)
-----   Message from Venora Maples, MD sent at 02/26/2021  8:29 AM EDT ----- Please let patient know hcg was negative

## 2021-02-26 NOTE — Telephone Encounter (Signed)
-----   Message from Bernerd Limbo, [place] sent at 02/26/2021 12:19 PM EDT ----- Regarding: RE: questions about lab results Hi Bonita Quin, Can you call the patient and explain that the findings do not indicate an abnormality (according to the interpretation) and that the fertility specialist I referred her to can explain in further detail? She may need help getting that appointment scheduled.  Thank you, Harvin Hazel ----- Message ----- From: Gerome Apley, RN Sent: 02/25/2021   9:45 AM EDT To: Bernerd Limbo, CNM Subject: questions about lab results                    Wants to know results and what they mean.  Can you call her or send message what to tell her about von willebrand result, coag studies Bonita Quin

## 2021-02-27 LAB — URINE CULTURE

## 2021-02-28 ENCOUNTER — Ambulatory Visit (HOSPITAL_COMMUNITY): Payer: Medicaid Other

## 2021-02-28 ENCOUNTER — Ambulatory Visit (HOSPITAL_COMMUNITY)
Admission: EM | Admit: 2021-02-28 | Discharge: 2021-02-28 | Disposition: A | Discharge status: Home / Self Care | Payer: Medicaid Other | Attending: Physician Assistant | Admitting: Physician Assistant

## 2021-02-28 ENCOUNTER — Ambulatory Visit (INDEPENDENT_AMBULATORY_CARE_PROVIDER_SITE_OTHER): Payer: Medicaid Other

## 2021-02-28 ENCOUNTER — Encounter (HOSPITAL_COMMUNITY): Payer: Self-pay

## 2021-02-28 ENCOUNTER — Other Ambulatory Visit: Payer: Self-pay

## 2021-02-28 DIAGNOSIS — R300 Dysuria: Secondary | ICD-10-CM | POA: Diagnosis present

## 2021-02-28 DIAGNOSIS — R55 Syncope and collapse: Secondary | ICD-10-CM | POA: Insufficient documentation

## 2021-02-28 DIAGNOSIS — R8290 Unspecified abnormal findings in urine: Secondary | ICD-10-CM | POA: Insufficient documentation

## 2021-02-28 DIAGNOSIS — R109 Unspecified abdominal pain: Secondary | ICD-10-CM

## 2021-02-28 LAB — CBC WITH DIFFERENTIAL/PLATELET
Abs Immature Granulocytes: 0.03 10*3/uL (ref 0.00–0.07)
Basophils Absolute: 0 10*3/uL (ref 0.0–0.1)
Basophils Relative: 0 %
Eosinophils Absolute: 0.1 10*3/uL (ref 0.0–0.5)
Eosinophils Relative: 1 %
HCT: 38.1 % (ref 36.0–46.0)
Hemoglobin: 12.3 g/dL (ref 12.0–15.0)
Immature Granulocytes: 0 %
Lymphocytes Relative: 20 %
Lymphs Abs: 2.3 10*3/uL (ref 0.7–4.0)
MCH: 27.2 pg (ref 26.0–34.0)
MCHC: 32.3 g/dL (ref 30.0–36.0)
MCV: 84.1 fL (ref 80.0–100.0)
Monocytes Absolute: 0.6 10*3/uL (ref 0.1–1.0)
Monocytes Relative: 5 %
Neutro Abs: 8.4 10*3/uL — ABNORMAL HIGH (ref 1.7–7.7)
Neutrophils Relative %: 74 %
Platelets: 274 10*3/uL (ref 150–400)
RBC: 4.53 MIL/uL (ref 3.87–5.11)
RDW: 14.2 % (ref 11.5–15.5)
WBC: 11.4 10*3/uL — ABNORMAL HIGH (ref 4.0–10.5)
nRBC: 0 % (ref 0.0–0.2)

## 2021-02-28 LAB — POCT URINALYSIS DIPSTICK, ED / UC
Glucose, UA: 100 mg/dL — AB
Nitrite: POSITIVE — AB
Protein, ur: 30 mg/dL — AB
Specific Gravity, Urine: 1.015 (ref 1.005–1.030)
Urobilinogen, UA: 2 mg/dL — ABNORMAL HIGH (ref 0.0–1.0)
pH: 5 (ref 5.0–8.0)

## 2021-02-28 LAB — COMPREHENSIVE METABOLIC PANEL
ALT: 19 U/L (ref 0–44)
AST: 19 U/L (ref 15–41)
Albumin: 3.9 g/dL (ref 3.5–5.0)
Alkaline Phosphatase: 39 U/L (ref 38–126)
Anion gap: 7 (ref 5–15)
BUN: 9 mg/dL (ref 6–20)
CO2: 28 mmol/L (ref 22–32)
Calcium: 9.5 mg/dL (ref 8.9–10.3)
Chloride: 103 mmol/L (ref 98–111)
Creatinine, Ser: 0.63 mg/dL (ref 0.44–1.00)
GFR, Estimated: >60 mL/min (ref >60)
Glucose, Bld: 118 mg/dL — ABNORMAL HIGH (ref 70–99)
Potassium: 3.7 mmol/L (ref 3.5–5.1)
Sodium: 138 mmol/L (ref 135–145)
Total Bilirubin: 0.7 mg/dL (ref 0.3–1.2)
Total Protein: 7.3 g/dL (ref 6.5–8.1)

## 2021-02-28 LAB — POC URINE PREG, ED: Preg Test, Ur: NEGATIVE

## 2021-02-28 MED ORDER — SULFAMETHOXAZOLE-TRIMETHOPRIM 800-160 MG PO TABS: 1.0000 | ORAL_TABLET | Freq: Two times a day (BID) | ORAL | 0 refills | Status: AC

## 2021-02-28 MED ORDER — LIDOCAINE HCL (PF) 1 % IJ SOLN
INTRAMUSCULAR | Status: AC
  Filled 2021-02-28: qty 2

## 2021-02-28 MED ORDER — CEFTRIAXONE SODIUM 1 G IJ SOLR
1.0000 g | Freq: Once | INTRAMUSCULAR | Status: AC
  Administered 2021-02-28: 1 g via INTRAMUSCULAR

## 2021-02-28 MED ORDER — CEFTRIAXONE SODIUM 1 G IJ SOLR
INTRAMUSCULAR | Status: AC
  Filled 2021-02-28: qty 10

## 2021-02-28 MED ORDER — TAMSULOSIN HCL 0.4 MG PO CAPS: 0.4000 mg | ORAL_CAPSULE | Freq: Every day | ORAL | 0 refills | Status: DC

## 2021-02-28 NOTE — ED Provider Notes (Signed)
[facility]    CSN: [id number] Arrival date & time: 02/28/21  1646      History   Chief Complaint Chief Complaint  Patient presents with   Abdominal Pain   Flank Pain   Dysuria    HPI Darlene Cruz is a 35 y.o. female.   Patient presents today with a 5-day history of bilateral flank pain.  Patient is Arabic speaking and video interpreter is utilized during this visit.  She was seen 02/25/2021 at which point she had negative hCG level and she was started on Pyridium and Macrobid.  Urine culture obtained during this visit grew mixed urogenital flora.  She has been taking antibiotic as prescribed but has not had any improvement of symptoms.  Patient did not have any imaging while in the emergency room; was seen at [facility].  She reports pain is rated 10 on a 0-10 pain scale, localized to left flank with radiation towards groin, described as aching with periodic shooting pains, worse with palpation, no relieving factors identified.  She denies history of nephrolithiasis or pyelonephritis.  She denies any fever, nausea, vomiting, chest pain, shortness of breath, vaginal symptoms.  She was having missed menstrual cycle but has been evaluated for this and had multiple negative pregnancy test.  Patient is anxious to feel better she is scheduled to have [place] strep test on Thursday (03/06/2021) and she has waited 6 years for this appointment.  She is very concerned that she will continue to have symptoms at the time of her appointment which will impact her performance on this test.   Past Medical History:  Diagnosis Date   Abdominal cramping 06/05/2015   Dizziness 06/05/2015   Ear pain 06/05/2015   Morbid obesity (HCC)    Pelvic pain in female 03/12/2016   Weight gain 06/05/2015    Patient Active Problem List   Diagnosis Date Noted   Morbid obesity (HCC)    History of vitamin D deficiency 05/15/2019    Past Surgical History:  Procedure Laterality Date    CESAREAN SECTION     CESAREAN SECTION     KNEE SURGERY Left    TONSILLECTOMY AND ADENOIDECTOMY      OB History     Gravida  6   Para  1   Term  1   Preterm  0   AB  5   Living  1      SAB  5   IAB  0   Ectopic  0   Multiple  0   Live Births  1            Home Medications    Prior to Admission medications   Medication Sig Start Date End Date Taking? Authorizing Provider  phenazopyridine (PYRIDIUM) 200 MG tablet Take 1 tablet (200 mg total) by mouth 3 (three) times daily as needed for pain. 02/25/21  Yes Venora Maples, MD  sulfamethoxazole-trimethoprim (BACTRIM DS) 800-160 MG tablet Take 1 tablet by mouth 2 (two) times daily for 7 days. 02/28/21 03/07/21 Yes Ekin Pilar, Noberto Retort, PA-C  tamsulosin (FLOMAX) 0.4 MG CAPS capsule Take 1 capsule (0.4 mg total) by mouth daily. 02/28/21  Yes Adlee Paar, Noberto Retort, PA-C  acetaminophen (TYLENOL) 500 MG tablet Take 2 tablets (1,000 mg total) by mouth every 6 (six) hours as needed for headache. 08/25/17   Lizbeth Bark, FNP  Cholecalciferol (VITAMIN D PO) Take 1 tablet by mouth once a week. Every 5 days    [provider]  meloxicam (MOBIC) 15 MG tablet Take 1 tablet by mouth daily. 01/27/21   [provider]  mirtazapine (REMERON) 15 MG tablet Take 15 mg by mouth at bedtime. 11/04/20   [provider]    Family History Family History  Problem Relation Age of Onset   Hypertension Mother    Diabetes Mother    Hyperlipidemia Mother     Social History Social History   Tobacco Use   Smoking status: Former    Packs/day: 0.20    Years: 5.00    Pack years: 1.00    Types: Cigarettes   Smokeless tobacco: Never   Tobacco comments:    quit July 2016   Substance Use Topics   Alcohol use: No    Alcohol/week: 0.0 standard drinks   Drug use: No     Allergies   Patient has no known allergies.   Review of Systems Review of Systems  Constitutional:  Negative for activity change, appetite change,  fatigue and fever.  Respiratory:  Negative for cough and shortness of breath.   Cardiovascular:  Negative for chest pain.  Gastrointestinal:  Positive for abdominal pain. Negative for diarrhea, nausea and vomiting.  Genitourinary:  Positive for dysuria, flank pain and frequency. Negative for hematuria, pelvic pain, urgency, vaginal bleeding, vaginal discharge and vaginal pain.  Neurological:  Negative for dizziness, light-headedness and headaches.    Physical Exam Triage Vital Signs ED Triage Vitals  Enc Vitals Group     BP 02/28/21 1753 117/64     Pulse Rate 02/28/21 1753 62     Resp 02/28/21 1753 18     Temp 02/28/21 1753 98.6 F (37 C)     Temp src --      SpO2 02/28/21 1753 98 %     Weight --      Height --      Head Circumference --      Peak Flow --      Pain Score 02/28/21 1737 10     Pain Loc --      Pain Edu? --      Excl. in GC? --    No data found.  Updated Vital Signs BP 114/74 (BP Location: Right Wrist)   Pulse 76   Temp 98.6 F (37 C)   Resp 18   LMP 01/15/2021   SpO2 96%   Visual Acuity Right Eye Distance:   Left Eye Distance:   Bilateral Distance:    Right Eye Near:   Left Eye Near:    Bilateral Near:     Physical Exam Vitals reviewed.  Constitutional:      General: She is awake. She is not in acute distress.    Appearance: Normal appearance. She is normal weight. She is not ill-appearing.     Comments: Very pleasant female appears stated age in no acute distress  HENT:     Head: Normocephalic and atraumatic.  Cardiovascular:     Rate and Rhythm: Normal rate and regular rhythm.     Heart sounds: Normal heart sounds, S1 normal and S2 normal. No murmur heard. Pulmonary:     Effort: Pulmonary effort is normal.     Breath sounds: Normal breath sounds. No wheezing, rhonchi or rales.     Comments: Clear to auscultation bilaterally Abdominal:     General: Bowel sounds are normal.     Palpations: Abdomen is soft.     Tenderness: There is  abdominal tenderness in the right lower quadrant,  suprapubic area and left lower quadrant. There is left CVA tenderness. There is no right CVA tenderness, guarding or rebound.  Psychiatric:        Behavior: Behavior is cooperative.     UC Treatments / Results  Labs (all labs ordered are listed, but only abnormal results are displayed) Labs Reviewed  POCT URINALYSIS DIPSTICK, ED / UC - Abnormal; Notable for the following components:      Result Value   Glucose, UA 100 (*)    Bilirubin Urine SMALL (*)    Ketones, ur TRACE (*)    Hgb urine dipstick MODERATE (*)    Protein, ur 30 (*)    Urobilinogen, UA 2.0 (*)    Nitrite POSITIVE (*)    Leukocytes,Ua LARGE (*)    All other components within normal limits  URINE CULTURE  CBC WITH DIFFERENTIAL/PLATELET  COMPREHENSIVE METABOLIC PANEL  POC URINE PREG, ED    EKG   Radiology DG Abdomen 1 View  Result Date: 02/28/2021 CLINICAL DATA:  Flank pain EXAM: ABDOMEN - 1 VIEW COMPARISON:  CT 11/19/2016 FINDINGS: The bowel gas pattern is normal. No radio-opaque calculi or other significant radiographic abnormality are seen. IMPRESSION: Negative. Electronically Signed   By: Jasmine Pang M.D.   On: 02/28/2021 19:15    Procedures Procedures (including critical care time)  Medications Ordered in UC Medications  cefTRIAXone (ROCEPHIN) injection 1 g (1 g Intramuscular Given 02/28/21 1835)    Initial Impression / Assessment and Plan / UC Course  I have reviewed the triage vital signs and the nursing notes.  Pertinent labs & imaging results that were available during my care of the patient were reviewed by me and considered in my medical decision making (see chart for details).     Discussed with patient that the safest thing to do is to go to the emergency room for further evaluation and management the patient declined this today.  UA showed evidence of infection and she was given Rocephin in clinic and started on Bactrim.  Urine culture  obtained today-results pending.  CBC and CMP obtained today-results pending.  Patient had vasovagal syncopal episode following blood draw that normalized with elevation of feet.  Blood pressure was rechecked and was 114/76.  Patient quickly recovered and reports that she has a history of similar symptoms with blood draws in the past.  She was monitored for 30 to 45 minutes and reports feeling her normal self at the time of discharge.  She was accompanied by her friend who will drive her home.  KUB obtained showed no obvious radiopaque calculus.  Discussed that this increases my concern for a different intra-abdominal etiology and that imaging would be the safest thing to do but patient declined this today.  She will go to the emergency room if she does not feel better overnight or if anything worsens.  She was provided contact information for OB/GYN and urology as requested.  Discussed alarm symptoms that warrant emergent evaluation.  Strict return precautions given to which patient expressed understanding.   Final Clinical Impressions(s) / UC Diagnoses   Final diagnoses:  Left flank pain  Dysuria  Abnormal urinalysis  Vasovagal syncope     Discharge Instructions      I do think the safest thing to do is to go to the emergency room for imaging.  We have given you a shot of Rocephin I would like you to start taking Bactrim DS twice daily.  You can use Flomax to help pass the  kidney stone.  This can lower your blood pressure so if you feel lightheaded or dizzy please stop the medication and be seen immediately.  If your symptoms improve with this medication you do not need to go to the ER please follow-up with urology first thing next week.      ED Prescriptions     Medication Sig Dispense Auth. Provider   sulfamethoxazole-trimethoprim (BACTRIM DS) 800-160 MG tablet Take 1 tablet by mouth 2 (two) times daily for 7 days. 14 tablet Anthem Frazer K, PA-C   tamsulosin (FLOMAX) 0.4 MG CAPS capsule  Take 1 capsule (0.4 mg total) by mouth daily. 10 capsule Kadelyn Dimascio, Noberto Retort, PA-C      PDMP not reviewed this encounter.   Jeani Hawking, PA-C 02/28/21 1930

## 2021-02-28 NOTE — ED Triage Notes (Addendum)
Pt reports missed period for 12 days. C/o severe back pain for 5 days in both flanks. Reports had positive home pregnancy test last Friday but negative pregnancy test in the ER 3 days ago. Also c/o severe abdominal pain especially on left side, worsening yesterday. C/o abdominal pain worsening with urination and that she saw what looked like sand in her urine. Reports feeling hot. Reports pain was so bad she felt she would "pass out".   Pt states went to ER 3 days ago and was told had blood in urine, was started on medication which did not help.

## 2021-02-28 NOTE — Discharge Instructions (Addendum)
I do think the safest thing to do is to go to the emergency room for imaging.  We have given you a shot of Rocephin I would like you to start taking Bactrim DS twice daily.  You can use Flomax to help pass the kidney stone.  This can lower your blood pressure so if you feel lightheaded or dizzy please stop the medication and be seen immediately.  If your symptoms improve with this medication you do not need to go to the ER please follow-up with urology first thing next week.

## 2021-03-01 LAB — URINE CULTURE: Culture: NO GROWTH

## 2021-03-11 ENCOUNTER — Other Ambulatory Visit: Payer: Self-pay

## 2021-03-11 ENCOUNTER — Encounter (HOSPITAL_COMMUNITY): Payer: Self-pay | Admitting: Emergency Medicine

## 2021-03-11 ENCOUNTER — Ambulatory Visit (HOSPITAL_COMMUNITY)
Admission: EM | Admit: 2021-03-11 | Discharge: 2021-03-11 | Disposition: A | Discharge status: Home / Self Care | Payer: Medicaid Other | Attending: Family Medicine | Admitting: Family Medicine

## 2021-03-11 DIAGNOSIS — L309 Dermatitis, unspecified: Secondary | ICD-10-CM

## 2021-03-11 DIAGNOSIS — R21 Rash and other nonspecific skin eruption: Secondary | ICD-10-CM

## 2021-03-11 MED ORDER — TRIAMCINOLONE ACETONIDE 0.1 % EX CREA: 1.0000 "application " | TOPICAL_CREAM | Freq: Two times a day (BID) | CUTANEOUS | 0 refills | Status: DC

## 2021-03-11 MED ORDER — PREDNISONE 10 MG PO TABS: ORAL_TABLET | ORAL | 0 refills | Status: DC

## 2021-03-11 NOTE — ED Provider Notes (Signed)
[facility]    CSN: [id number] Arrival date & time: 03/11/21  1822      History   Chief Complaint Chief Complaint  Patient presents with   Rash   Allergies    HPI Darlene Cruz is a 35 y.o. female.   Presenting today with 3-day history of itchy rash spreading all over her body.  She states it happened shortly after starting Flomax and Bactrim for a suspected kidney stone/UTI that she was seen here for.  So far she has tried Benadryl and over-the-counter creams with no relief.  Denies throat swelling or itching, trouble breathing, nausea vomiting or diarrhea, wheezing, chest pain.  No past history of medication reactions.  No new products, recent travel, new foods or supplements.        Past Medical History:  Diagnosis Date   Abdominal cramping 06/05/2015   Dizziness 06/05/2015   Ear pain 06/05/2015   Morbid obesity (HCC)    Pelvic pain in female 03/12/2016   Weight gain 06/05/2015    Patient Active Problem List   Diagnosis Date Noted   Morbid obesity (HCC)    History of vitamin D deficiency 05/15/2019    Past Surgical History:  Procedure Laterality Date   CESAREAN SECTION     CESAREAN SECTION     KNEE SURGERY Left    TONSILLECTOMY AND ADENOIDECTOMY      OB History     Gravida  6   Para  1   Term  1   Preterm  0   AB  5   Living  1      SAB  5   IAB  0   Ectopic  0   Multiple  0   Live Births  1            Home Medications    Prior to Admission medications   Medication Sig Start Date End Date Taking? Authorizing Provider  predniSONE (DELTASONE) 10 MG tablet Take 6 tabs daily x 2 days, 5 tabs daily x 2 days, 4 tabs daily x 2 days, etc 03/11/21  Yes Particia Nearing, PA-C  triamcinolone cream (KENALOG) 0.1 % Apply 1 application topically 2 (two) times daily. 03/11/21  Yes Particia Nearing, PA-C  acetaminophen (TYLENOL) 500 MG tablet Take 2 tablets (1,000 mg total) by mouth every 6 (six) hours as needed  for headache. 08/25/17   Lizbeth Bark, FNP  Cholecalciferol (VITAMIN D PO) Take 1 tablet by mouth once a week. Every 5 days    [provider]  meloxicam (MOBIC) 15 MG tablet Take 1 tablet by mouth daily. 01/27/21   [provider]  mirtazapine (REMERON) 15 MG tablet Take 15 mg by mouth at bedtime. 11/04/20   [provider]  phenazopyridine (PYRIDIUM) 200 MG tablet Take 1 tablet (200 mg total) by mouth 3 (three) times daily as needed for pain. 02/25/21   Venora Maples, MD  tamsulosin (FLOMAX) 0.4 MG CAPS capsule Take 1 capsule (0.4 mg total) by mouth daily. 02/28/21   Raspet, Noberto Retort, PA-C    Family History Family History  Problem Relation Age of Onset   Hypertension Mother    Diabetes Mother    Hyperlipidemia Mother     Social History Social History   Tobacco Use   Smoking status: Former    Packs/day: 0.20    Years: 5.00    Pack years: 1.00    Types: Cigarettes   Smokeless tobacco: Never  Tobacco comments:    quit July 2016   Substance Use Topics   Alcohol use: No    Alcohol/week: 0.0 standard drinks   Drug use: No     Allergies   Patient has no known allergies.   Review of Systems Review of Systems Per HPI  Physical Exam Triage Vital Signs ED Triage Vitals  Enc Vitals Group     BP 03/11/21 1941 111/63     Pulse Rate 03/11/21 1938 79     Resp 03/11/21 1938 18     Temp 03/11/21 1941 97.8 F (36.6 C)     Temp Source 03/11/21 1938 Oral     SpO2 03/11/21 1938 100 %     Weight --      Height --      Head Circumference --      Peak Flow --      Pain Score 03/11/21 1935 0     Pain Loc --      Pain Edu? --      Excl. in GC? --    No data found.  Updated Vital Signs BP 111/63   Pulse 79   Temp 97.8 F (36.6 C)   Resp 18   SpO2 100%   Visual Acuity Right Eye Distance:   Left Eye Distance:   Bilateral Distance:    Right Eye Near:   Left Eye Near:    Bilateral Near:     Physical Exam Vitals and nursing note  reviewed.  Constitutional:      Appearance: Normal appearance. She is not ill-appearing.  HENT:     Head: Atraumatic.     Mouth/Throat:     Mouth: Mucous membranes are moist.     Pharynx: Oropharynx is clear.     Comments: Oral airway patent Eyes:     Extraocular Movements: Extraocular movements intact.     Conjunctiva/sclera: Conjunctivae normal.  Cardiovascular:     Rate and Rhythm: Normal rate and regular rhythm.     Heart sounds: Normal heart sounds.  Pulmonary:     Effort: Pulmonary effort is normal.     Breath sounds: Normal breath sounds. No wheezing or rales.  Musculoskeletal:        General: Normal range of motion.     Cervical back: Normal range of motion and neck supple.  Skin:    General: Skin is warm and dry.     Findings: Rash present.     Comments: Sporadic erythematous papular rash across extremities, chest, abdomen  Neurological:     Mental Status: She is alert and oriented to person, place, and time.  Psychiatric:        Mood and Affect: Mood normal.        Thought Content: Thought content normal.        Judgment: Judgment normal.     UC Treatments / Results  Labs (all labs ordered are listed, but only abnormal results are displayed) Labs Reviewed - No data to display  EKG   Radiology No results found.  Procedures Procedures (including critical care time)  Medications Ordered in UC Medications - No data to display  Initial Impression / Assessment and Plan / UC Course  I have reviewed the triage vital signs and the nursing notes.  Pertinent labs & imaging results that were available during my care of the patient were reviewed by me and considered in my medical decision making (see chart for details).     Unclear if related to medications  or other trigger.  She started 2 medications simultaneously so difficult to determine if either are involved.  She has since stopped the medications with no benefit multiple days ago so lower suspicion of  this.  Extended prednisone taper, Kenalog, antihistamines recommended.  Follow-up if worsening or not resolving.  Final Clinical Impressions(s) / UC Diagnoses   Final diagnoses:  Rash and nonspecific skin eruption  Eczema, unspecified type   Discharge Instructions   None    ED Prescriptions     Medication Sig Dispense Auth. Provider   predniSONE (DELTASONE) 10 MG tablet Take 6 tabs daily x 2 days, 5 tabs daily x 2 days, 4 tabs daily x 2 days, etc 42 tablet Particia Nearing, PA-C   triamcinolone cream (KENALOG) 0.1 % Apply 1 application topically 2 (two) times daily. 90 g Particia Nearing, [place]      PDMP not reviewed this encounter.   Particia Nearing, [place] 03/14/21 1036

## 2021-03-11 NOTE — ED Triage Notes (Signed)
Pt is present today with c/o with itching all over patient body. Pt states that she noticed the irritation 3 days ago. Pt states that she after she started the medication that was previously prescribed she noticed the itching.

## 2021-03-15 ENCOUNTER — Emergency Department (HOSPITAL_COMMUNITY)
Admission: EM | Admit: 2021-03-15 | Discharge: 2021-03-16 | Disposition: A | Discharge status: Home / Self Care | Payer: Medicaid Other | Attending: Emergency Medicine | Admitting: Emergency Medicine

## 2021-03-15 ENCOUNTER — Other Ambulatory Visit: Payer: Self-pay

## 2021-03-15 ENCOUNTER — Encounter (HOSPITAL_COMMUNITY): Payer: Self-pay | Admitting: Emergency Medicine

## 2021-03-15 DIAGNOSIS — Z87891 Personal history of nicotine dependence: Secondary | ICD-10-CM | POA: Diagnosis not present

## 2021-03-15 DIAGNOSIS — L509 Urticaria, unspecified: Secondary | ICD-10-CM | POA: Insufficient documentation

## 2021-03-15 DIAGNOSIS — R21 Rash and other nonspecific skin eruption: Secondary | ICD-10-CM

## 2021-03-15 NOTE — ED Triage Notes (Signed)
Pt reports itchy rash all over x 9 days after receiving bactrim and flomax for a UTI.  Seen at [facility] on 6/28 and taking meds without relief.

## 2021-03-15 NOTE — ED Notes (Signed)
No answer for triage.

## 2021-03-15 NOTE — ED Provider Notes (Signed)
Emergency Medicine Provider Triage Evaluation Note  Darlene Cruz , a 35 y.o. female  was evaluated in triage.  Pt complains of diffuse itchy rash for the past 9 days after being prescribed bactrim and flomax for a UTI. She went to UC on 06/28 for same and was prescribed prednisone taper, triamcinolone, and OTC antihistamines which pt has been taking without relief. She denies any other triggers. Denies new foods, hygeine products, pets. No throat swelling, difficulty breathing, wheezing, abdominal pain, nausea, vomiting.  Review of Systems  Positive: + rash Negative: - wheezing, throat swelling, rash  Physical Exam  BP (!) 99/56 (BP Location: Left Arm)   Pulse 62   Temp 98.3 F (36.8 C) (Oral)   Resp 16   SpO2 98%  Gen:   Awake, no distress   Resp:  Normal effort  MSK:   Moves extremities without difficulty  Other:  Rash consistent with contact dermatitis  Medical Decision Making  Medically screening exam initiated at 5:15 PM.  Appropriate orders placed.  Darlene Cruz was informed that the remainder of the evaluation will be completed by another provider, this initial triage assessment does not replace that evaluation, and the importance of remaining in the ED until their evaluation is complete.     Tanda Rockers, PA-C 03/15/21 1717    Koleen Distance, MD 03/15/21 [phone]

## 2021-03-16 MED ORDER — HYDROCORTISONE 2.5 % EX LOTN: TOPICAL_LOTION | Freq: Two times a day (BID) | CUTANEOUS | 0 refills | Status: DC

## 2021-03-16 MED ORDER — HYDROXYZINE HCL 25 MG PO TABS
50.0000 mg | ORAL_TABLET | Freq: Once | ORAL | Status: AC
  Administered 2021-03-16: 50 mg via ORAL
  Filled 2021-03-16: qty 2

## 2021-03-16 MED ORDER — HYDROXYZINE HCL 25 MG PO TABS: 25.0000 mg | ORAL_TABLET | Freq: Four times a day (QID) | ORAL | 0 refills | Status: DC | PRN

## 2021-03-16 MED ORDER — FAMOTIDINE 20 MG PO TABS: 20.0000 mg | ORAL_TABLET | Freq: Two times a day (BID) | ORAL | 0 refills | Status: DC

## 2021-03-16 NOTE — Discharge Instructions (Addendum)
1. Medications: Continue Prednisone, Start Pepcid, Start hydrocortisone lotion, STOP benadryl and triamcinolone; usual home medications 2. Treatment: rest, drink plenty of fluids, take medications as prescribed 3. Follow Up: Please followup with the asthma and allergy center; if you do not have a primary care doctor use the resource guide provided to find one; followup with dermatology as needed; Return to the ER for difficulty breathing, return of allergic reaction or other concerning symptoms

## 2021-03-16 NOTE — ED Provider Notes (Signed)
[facility] EMERGENCY DEPARTMENT Provider Note   CSN: [id number] Arrival date & time: 03/15/21  1617     History Chief Complaint  Patient presents with   Rash    Blake Jamal Alhaj Hamad is a 35 y.o. female presents to the Emergency Department complaining of gradual, persistent, progressively worsening rash onset 9 days ago.  Pt reports she was treated with bactrim and Flomax for a UTI on 02/28/21  This improved.  Pt was then bitten by a bug on her left ankle.  She reports she then developed a diffuse rash.   She reports intense bouts of itching lasting approx at a time and then improving.  She returned to the UC on 6/28and was given triamcinolone and prednisone taper without relief.  She has also been taking benadryl without relief. She reports itching is worst at night.  Denies new environmental exposures including detergent, lotions.  No other aggravating or alleviating factors.  Denies fever, chills headache, neck pain, chest pain.     The history is provided by medical records. The history is limited by a language barrier. A language interpreter was used.      Past Medical History:  Diagnosis Date   Abdominal cramping 06/05/2015   Dizziness 06/05/2015   Ear pain 06/05/2015   Morbid obesity (HCC)    Pelvic pain in female 03/12/2016   Weight gain 06/05/2015    Patient Active Problem List   Diagnosis Date Noted   Morbid obesity (HCC)    History of vitamin D deficiency 05/15/2019    Past Surgical History:  Procedure Laterality Date   CESAREAN SECTION     CESAREAN SECTION     KNEE SURGERY Left    TONSILLECTOMY AND ADENOIDECTOMY       OB History     Gravida  6   Para  1   Term  1   Preterm  0   AB  5   Living  1      SAB  5   IAB  0   Ectopic  0   Multiple  0   Live Births  1           Family History  Problem Relation Age of Onset   Hypertension Mother    Diabetes Mother    Hyperlipidemia Mother     Social History    Tobacco Use   Smoking status: Former    Packs/day: 0.20    Years: 5.00    Pack years: 1.00    Types: Cigarettes   Smokeless tobacco: Never   Tobacco comments:    quit July 2016   Substance Use Topics   Alcohol use: No    Alcohol/week: 0.0 standard drinks   Drug use: No    Home Medications Prior to Admission medications   Medication Sig Start Date End Date Taking? Authorizing Provider  famotidine (PEPCID) 20 MG tablet Take 1 tablet (20 mg total) by mouth 2 (two) times daily. 03/16/21  Yes Ayelen Sciortino, Dahlia Client, PA-C  hydrocortisone 2.5 % lotion Apply topically 2 (two) times daily. 03/16/21  Yes Haislee Corso, Dahlia Client, PA-C  hydrOXYzine (ATARAX/VISTARIL) 25 MG tablet Take 1 tablet (25 mg total) by mouth every 6 (six) hours as needed for itching. 03/16/21  Yes Gordan Grell, Dahlia Client, PA-C  acetaminophen (TYLENOL) 500 MG tablet Take 2 tablets (1,000 mg total) by mouth every 6 (six) hours as needed for headache. 08/25/17   Lizbeth Bark, FNP  Cholecalciferol (VITAMIN D PO) Take 1 tablet  by mouth once a week. Every 5 days    [provider]  meloxicam (MOBIC) 15 MG tablet Take 1 tablet by mouth daily. 01/27/21   [provider]  mirtazapine (REMERON) 15 MG tablet Take 15 mg by mouth at bedtime. 11/04/20   [provider]  phenazopyridine (PYRIDIUM) 200 MG tablet Take 1 tablet (200 mg total) by mouth 3 (three) times daily as needed for pain. 02/25/21   Venora Maples, MD  predniSONE (DELTASONE) 10 MG tablet Take 6 tabs daily x 2 days, 5 tabs daily x 2 days, 4 tabs daily x 2 days, etc 03/11/21   Particia Nearing, PA-C  tamsulosin (FLOMAX) 0.4 MG CAPS capsule Take 1 capsule (0.4 mg total) by mouth daily. 02/28/21   Raspet, Noberto Retort, PA-C    Allergies    Patient has no known allergies.  Review of Systems   Review of Systems  Constitutional:  Negative for appetite change, diaphoresis, fatigue, fever and unexpected weight change.  HENT:  Negative for mouth sores.    Eyes:  Negative for visual disturbance.  Respiratory:  Negative for cough, chest tightness, shortness of breath and wheezing.   Cardiovascular:  Negative for chest pain.  Gastrointestinal:  Negative for abdominal pain, constipation, diarrhea, nausea and vomiting.  Endocrine: Negative for polydipsia, polyphagia and polyuria.  Genitourinary:  Negative for dysuria, frequency, hematuria and urgency.  Musculoskeletal:  Negative for back pain and neck stiffness.  Skin:  Positive for rash.  Allergic/Immunologic: Negative for immunocompromised state.  Neurological:  Negative for syncope, light-headedness and headaches.  Hematological:  Does not bruise/bleed easily.  Psychiatric/Behavioral:  Negative for sleep disturbance. The patient is not nervous/anxious.    Physical Exam Updated Vital Signs BP (!) 111/58   Pulse (!) 57   Temp 98.3 F (36.8 C) (Oral)   Resp 16   LMP 03/05/2021   SpO2 99%   Physical Exam Vitals and nursing note reviewed.  Constitutional:      General: She is not in acute distress.    Appearance: She is well-developed. She is not ill-appearing.  HENT:     Head: Normocephalic.  Eyes:     General: No scleral icterus.    Conjunctiva/sclera: Conjunctivae normal.  Cardiovascular:     Rate and Rhythm: Normal rate.  Pulmonary:     Effort: Pulmonary effort is normal.  Abdominal:     General: There is no distension.  Musculoskeletal:        General: Normal range of motion.     Cervical back: Normal range of motion.  Skin:    General: Skin is warm and dry.     Findings: Rash present.     Comments: Mild Urticarial rash scattered over much of the body.  Abrasions throughout.  No evidence of secondary infection.  No open wounds.  Neurological:     Mental Status: She is alert.  Psychiatric:        Mood and Affect: Mood normal.    ED Results / Procedures / Treatments     Procedures Procedures   Medications Ordered in ED Medications  hydrOXYzine  (ATARAX/VISTARIL) tablet 50 mg (has no administration in time range)    ED Course  I have reviewed the triage vital signs and the nursing notes.  Pertinent labs & imaging results that were available during my care of the patient were reviewed by me and considered in my medical decision making (see chart for details).    MDM Rules/Calculators/A&P  Patient presents with rash which appears to be consistent with contact dermatitis..  She has excoriation over most of her body.  No evidence of secondary infection.  She is 4 days into her prednisone.  We will change therapies and give hydrocortisone and Atarax.  We will discontinue triamcinolone and Benadryl.  Patient will be referred to the asthma and allergy center for further evaluation and treatment.   Final Clinical Impression(s) / ED Diagnoses Final diagnoses:  Rash and nonspecific skin eruption    Rx / DC Orders ED Discharge Orders          Ordered    hydrocortisone 2.5 % lotion  2 times daily        03/16/21 0230    hydrOXYzine (ATARAX/VISTARIL) 25 MG tablet  Every 6 hours PRN        03/16/21 0230    famotidine (PEPCID) 20 MG tablet  2 times daily        03/16/21 0230             Charise Leinbach, [place], PA-C 03/16/21 0240    Gilda Crease, MD 03/16/21 [phone]

## 2021-03-20 ENCOUNTER — Ambulatory Visit (INDEPENDENT_AMBULATORY_CARE_PROVIDER_SITE_OTHER): Payer: Medicaid Other | Admitting: Family Medicine

## 2021-03-20 ENCOUNTER — Other Ambulatory Visit: Payer: Self-pay

## 2021-03-20 ENCOUNTER — Encounter: Payer: Self-pay | Admitting: Family Medicine

## 2021-03-20 VITALS — BP 111/84 | HR 90 | Wt 273.0 lb

## 2021-03-20 DIAGNOSIS — N926 Irregular menstruation, unspecified: Secondary | ICD-10-CM

## 2021-03-20 DIAGNOSIS — R102 Pelvic and perineal pain: Secondary | ICD-10-CM | POA: Diagnosis not present

## 2021-03-20 DIAGNOSIS — N83202 Unspecified ovarian cyst, left side: Secondary | ICD-10-CM

## 2021-03-20 DIAGNOSIS — R1084 Generalized abdominal pain: Secondary | ICD-10-CM | POA: Diagnosis not present

## 2021-03-20 DIAGNOSIS — Z319 Encounter for procreative management, unspecified: Secondary | ICD-10-CM

## 2021-03-20 DIAGNOSIS — N83201 Unspecified ovarian cyst, right side: Secondary | ICD-10-CM

## 2021-03-20 NOTE — Patient Instructions (Addendum)
For Pelvic pain Try NSAID (ibuprofen) 800mg  every 8 hours for 4 days.    Recommend sperm assessment to determine female factor to inability to achieve pregnancy.    [facility] for female fertility testing [address] [place] [place], [place] [phone]  Ask for Dr. [phone] FAMILY PRACTICE PHYSICIANS  [place] [phone]) [facility] [address] [place], [place] [phone] Mon-Fri 8:30-12:30, 1:30-5:00 Accepting Medicaid  [facility] [address] [place], [place] [phone], Fri 8:30-5:00, Wed 10:00-7:00 (closed 1-2pm) Accepting Medicaid   [place] [phone])  [facility]s at [facility])  [address] [place], [place] [phone] Mon-Fri 8:30-5:30, Sat (Oct.-Mar.) 9:00-1:00 Accepting [facility]   [place] [phone] & [phone]) [facility] [address] [place], [place] [phone] Mon-Thur 8:00-6:00 Accepting [facility] [address] [place], [place] [phone] Mon-Thur 7:30-7:30, Fri 7:30-4:30 Accepting Medicaid   [place] [phone] & [phone])  [facility] [address], [place], [place] [phone] Mon-Fri 8:00-5:00 Accepting Medicaid [facility] - [facility] [address] [place], [place], [place] [phone] Mon-Fri 8:00-5:00 Accepting [facility] [place] [phone])  [facility] Family [facility] - [facility] at [facility]) [address], [place], [place] [phone] Mon-Fri 8:00-5:00 Accepting Medicaid [facility] - [facility] ([facility]) [address], [place], [place] [phone] Mon-Fri 8:00-5:30, Sat&Sun by appointment (phones open at 8:30) Accepting [facility] [phone] & [phone]) [facility] [address] [place], [place] [phone] Mon-Thur 8:00-7:00, Fri 8:00-5:00, Sat 8:00-12:00, Sun 9:00-12:00 Accepting Medicaid [facility] Family Medicine at [facility] [address] [place], [place] [phone] Mon-Thur 8:00-5:00 Accepting Medicaid [facility] at [place] [address] [place], [place] [phone] Mon-Fri 8:00-5:30, Sat (Oct.-Mar.) 9:00-1:00 Accepting [facility]  [place] [phone]) [facility] [address] [place], [place] [phone] Mon-Fri 8:00-5:00 Accepting [facility]   [place] [phone])  [facility] Pediatric[facility] - [facility] [address] [facility] [address], [place], [place] [phone] Mon-Fri 8:00-5:00 After hours clinic [facility] [place], [place]) [phone] Mon-Fri 5:00-8:00, Sat 12:00-6:00, Sun 10:00-4:00 Accepting Medicaid

## 2021-03-20 NOTE — Progress Notes (Signed)
   GYNECOLOGY PROBLEM  VISIT ENCOUNTER NOTE  Subjective:   Darlene Cruz is a 35 y.o. [phone] female here for a a problem GYN visit.  Current complaints: see below.     Pelvic pain - had problem years ago, since starting menses. Along with period she had pain in stomach. 6 months ago she started having increase pain. Severe, located int eh back. Cannot stand for prolonged perios due to lower back pain. Reports inability to sleep. She feels warm during this periods.   LMP was 6 days ago still had pain. Located in low back and midline.  Reports left side pain.   Husband with low sperm count, erectile dysfunction, varicole in testicle  Patient reports her husband has not had an assessment and she did not follow up with [facility] because "insurance will not cover it"  Denies abnormal vaginal bleeding, discharge, pelvic pain, problems with intercourse or other gynecologic concerns.    Gynecologic History Patient's last menstrual period was 02/28/2021. Contraception: none  Health Maintenance Due  Topic Date Due   COVID-19 Vaccine (1) Never done     The following portions of the patient's history were reviewed and updated as appropriate: allergies, current medications, past family history, past medical history, past social history, past surgical history and problem list.  Review of Systems Pertinent items are noted in HPI.   Objective:  BP 111/84   Pulse 90   Wt 273 lb (123.8 kg)   LMP 02/28/2021   BMI 48.36 kg/m  Gen: well appearing, NAD HEENT: no scleral icterus CV: RR Lung: Normal WOB Ext: warm well perfused  PELVIC: deferred   Assessment and Plan:   1. Morbid obesity (HCC) BMI 48 Desires to lose weight Recommended she est PCP and consider referral for weight loss  2. Generalized abdominal pain - US PELVIS (TRANSABDOMINAL ONLY); Future  3. Pelvic pain - US PELVIS (TRANSABDOMINAL ONLY); Future - TSH - TestT+TestF+SHBG  4. Irregular  periods - TSH - TestT+TestF+SHBG - Anti mullerian hormone  5. Patient desires pregnancy - Discussed that infertility is not covered by Medicaid. Reviewed the importance of her husband having an assessment given prior sperm issues and now with erectile dysfunction. Encouraged them BOTH to establish PCP care.  - Anti mullerian hormone-- help assess ovarian reserve and possible response to ovarian stimulation.    Please refer to After Visit Summary for other counseling recommendations.   Return in about 3 months (around 06/20/2021) for fertility and pelvic pain.  Federico Flake, MD, MPH, ABFM Attending Physician Faculty Practice- Center for [facility]

## 2021-03-22 ENCOUNTER — Encounter: Payer: Self-pay | Admitting: Family Medicine

## 2021-03-27 ENCOUNTER — Other Ambulatory Visit: Payer: Self-pay

## 2021-03-27 ENCOUNTER — Ambulatory Visit
Admission: RE | Admit: 2021-03-27 | Discharge: 2021-03-27 | Disposition: A | Discharge status: Home / Self Care | Payer: Medicaid Other | Source: Ambulatory Visit | Attending: Family Medicine | Admitting: Family Medicine

## 2021-03-27 DIAGNOSIS — R102 Pelvic and perineal pain: Secondary | ICD-10-CM | POA: Diagnosis not present

## 2021-03-27 DIAGNOSIS — N926 Irregular menstruation, unspecified: Secondary | ICD-10-CM | POA: Diagnosis present

## 2021-03-28 LAB — TESTT+TESTF+SHBG
Sex Hormone Binding: 30.1 nmol/L (ref 24.6–122.0)
Testosterone, Free: 1.1 pg/mL (ref 0.0–4.2)
Testosterone, Total, LC/MS: 33.1 ng/dL (ref 10.0–55.0)

## 2021-03-28 LAB — ANTI MULLERIAN HORMONE: ANTI-MULLERIAN HORMONE (AMH): 4.89 ng/mL

## 2021-03-28 LAB — TSH: TSH: 0.931 u[IU]/mL (ref 0.450–4.500)

## 2021-03-28 NOTE — Addendum Note (Signed)
Addended by: Geanie Berlin on: 03/28/2021 05:04 PM   Modules accepted: Orders

## 2021-03-31 ENCOUNTER — Telehealth: Payer: Self-pay

## 2021-03-31 NOTE — Telephone Encounter (Addendum)
-----   Message from Federico Flake, MD sent at 03/28/2021  3:10 PM EDT ----- TSH and Testosterone , normal Her AMH shows normal egg reserve for a 35 yo and she is above median.    I still encourage her seek testing for her husband and possible low sperm count. All evidence shows Darlene Cruz should be able to get pregnant.   ----------------------------------  Called pt with [facility] interpreter ID [phone]. Result and provider recommendation given. Pt inquires about [place] result. I explained this does not appear to have been reviewed by provider but I will follow up with provider for recommendation.

## 2021-04-01 NOTE — Addendum Note (Signed)
Addended by: Geanie Berlin on: 04/01/2021 02:43 PM   Modules accepted: Orders

## 2021-04-02 ENCOUNTER — Telehealth: Payer: Self-pay

## 2021-04-02 NOTE — Telephone Encounter (Addendum)
Spoke with pt with [facility] interpreter [id number] # [phone]. Pt given results and recommendations per Dr Alvester Morin. Pt agreeable to plan of care.  LMP 03/31/21 Pt has scheduled [place] follow up on left ovarian cyst on 9/6@ 9 am.  Pt agreeable to date and time of appt.  Pt advised to go back to ER if having severe abd pain that is not controlled with Tylenol or Motrin. Pt verbalized understanding.   Judeth Cornfield, RN

## 2021-04-02 NOTE — Telephone Encounter (Signed)
-----   Message from Federico Flake, MD sent at 04/01/2021  2:43 PM EDT ----- US shows Left Ovarian Cyst- Complex. Needs follow up in 6-12 weeks and I ordered this.   Cyst is low torsion risk as it is under 5 cm

## 2021-05-07 ENCOUNTER — Ambulatory Visit: Payer: Medicaid Other | Admitting: Family Medicine

## 2021-05-20 ENCOUNTER — Ambulatory Visit (HOSPITAL_COMMUNITY)
Admission: EM | Admit: 2021-05-20 | Discharge: 2021-05-20 | Disposition: A | Discharge status: Home / Self Care | Payer: Medicaid Other | Attending: Family Medicine | Admitting: Family Medicine

## 2021-05-20 ENCOUNTER — Ambulatory Visit: Payer: Medicaid Other | Attending: Family Medicine

## 2021-05-20 ENCOUNTER — Encounter (HOSPITAL_COMMUNITY): Payer: Self-pay

## 2021-05-20 ENCOUNTER — Other Ambulatory Visit: Payer: Self-pay

## 2021-05-20 DIAGNOSIS — M25562 Pain in left knee: Secondary | ICD-10-CM | POA: Diagnosis not present

## 2021-05-20 MED ORDER — MELOXICAM 15 MG PO TABS: 15.0000 mg | ORAL_TABLET | Freq: Every day | ORAL | 0 refills | Status: DC

## 2021-05-20 NOTE — ED Triage Notes (Signed)
Pt in with c/o right knee pain x 4 days after a fall  Pt has been taking ibuprofen and tylenol with no relief   Pt states the pain is getting worse

## 2021-05-20 NOTE — ED Provider Notes (Signed)
[facility]    CSN: [id number] Arrival date & time: 05/20/21  1831      History   Chief Complaint Chief Complaint  Patient presents with   Knee Pain    HPI Zylpha Caryl Asp Hamad is a 35 y.o. female.   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 She has tried Tylenol without improvement No numbness and tingling Denies any hip or ankle pain She is wanting an MRI to ensure that her ligaments are normal   Past Medical History:  Diagnosis Date   Abdominal cramping 06/05/2015   Dizziness 06/05/2015   Ear pain 06/05/2015   Morbid obesity (HCC)    Pelvic pain in female 03/12/2016   Weight gain 06/05/2015    Patient Active Problem List   Diagnosis Date Noted   Morbid obesity (HCC)    History of vitamin D deficiency 05/15/2019    Past Surgical History:  Procedure Laterality Date   CESAREAN SECTION     CESAREAN SECTION     KNEE SURGERY Left    TONSILLECTOMY AND ADENOIDECTOMY      OB History     Gravida  6   Para  1   Term  1   Preterm  0   AB  5   Living  1      SAB  5   IAB  0   Ectopic  0   Multiple  0   Live Births  1            Home Medications    Prior to Admission medications   Medication Sig Start Date End Date Taking? Authorizing Provider  meloxicam (MOBIC) 15 MG tablet Take 1 tablet (15 mg total) by mouth daily. 05/20/21  Yes Ruthel Martine, Solmon Ice, DO  acetaminophen (TYLENOL) 500 MG tablet Take 2 tablets (1,000 mg total) by mouth every 6 (six) hours as needed for headache. 08/25/17   Lizbeth Bark, FNP  Cholecalciferol (VITAMIN D PO) Take 1 tablet by mouth  once a week. Every 5 days    [provider]  famotidine (PEPCID) 20 MG tablet Take 1 tablet (20 mg total) by mouth 2 (two) times daily. 03/16/21   Muthersbaugh, Dahlia Client, PA-C  hydrocortisone 2.5 % lotion Apply topically 2 (two) times daily. 03/16/21   Muthersbaugh, Dahlia Client, PA-C  hydrOXYzine (ATARAX/VISTARIL) 25 MG tablet Take 1 tablet (25 mg total) by mouth every 6 (six) hours as needed for itching. 03/16/21   Muthersbaugh, Dahlia Client, PA-C  mirtazapine (REMERON) 15 MG tablet Take 15 mg by mouth at bedtime. 11/04/20   [provider]  phenazopyridine (PYRIDIUM) 200 MG tablet Take 1 tablet (200 mg total) by mouth 3 (three) times daily as needed for pain. 02/25/21   Venora Maples, MD  predniSONE (DELTASONE) 10 MG tablet Take 6 tabs daily x 2 days, 5 tabs daily x 2 days, 4 tabs daily x 2 days, etc 03/11/21   Particia Nearing, PA-C  tamsulosin (FLOMAX) 0.4 MG CAPS capsule Take 1 capsule (0.4 mg total) by mouth daily. 02/28/21   Raspet, Noberto Retort, PA-C  Family History Family History  Problem Relation Age of Onset   Hypertension Mother    Diabetes Mother    Hyperlipidemia Mother     Social History Social History   Tobacco Use   Smoking status: Former    Packs/day: 0.20    Years: 5.00    Pack years: 1.00    Types: Cigarettes   Smokeless tobacco: Never   Tobacco comments:    quit July 2016   Substance Use Topics   Alcohol use: No    Alcohol/week: 0.0 standard drinks   Drug use: No     Allergies   Patient has no known allergies.   Review of Systems Review of Systems  All other systems reviewed and are negative. Per HPI  Physical Exam Triage Vital Signs ED Triage Vitals  Enc Vitals Group     BP 05/20/21 1919 106/63     Pulse Rate 05/20/21 1919 65     Resp 05/20/21 1917 19     Temp 05/20/21 1917 98.6 F (37 C)     Temp Source 05/20/21 1917 Oral     SpO2 05/20/21 1919 97 %     Weight --      Height --      Head Circumference --      Peak Flow --       Pain Score 05/20/21 1914 9     Pain Loc --      Pain Edu? --      Excl. in GC? --    No data found.  Updated Vital Signs BP 106/63 (BP Location: Right Arm)   Pulse 65   Temp 98.6 F (37 C) (Oral)   Resp 19   LMP 05/06/2021 (Exact Date)   SpO2 97%   Visual Acuity Right Eye Distance:   Left Eye Distance:   Bilateral Distance:    Right Eye Near:   Left Eye Near:    Bilateral Near:     Physical Exam Constitutional:      Appearance: Normal appearance.  HENT:     Head: Normocephalic and atraumatic.  Cardiovascular:     Rate and Rhythm: Normal rate.  Pulmonary:     Effort: Pulmonary effort is normal.     Breath sounds: Normal breath sounds.  Musculoskeletal:     Comments: Left Knee: - Inspection: no gross deformity b/l. No swelling/effusion, erythema or bruising b/l. Skin intact - Palpation: some TTP just distal to left lateral joint line  - ROM: full active ROM with flexion and extension in knee and hip b/l - Strength: 5/5 strength b/l - Neuro/vasc: NV intact distally b/l - Special Tests: - LIGAMENTS: negative anterior and posterior drawer, negative Lachman's, no MCL or LCL laxity but pain with varus stress -- MENISCUS: negative McMurray's -- PF JOINT: nml patellar mobility bilaterally.  negative patellar apprehension  Hips: normal ROM  Neurological:     General: No focal deficit present.     Mental Status: She is alert and oriented to person, place, and time.     UC Treatments / Results  Labs (all labs ordered are listed, but only abnormal results are displayed) Labs Reviewed - No data to display  EKG   Radiology No results found.  Procedures Procedures (including critical care time)  Medications Ordered in UC Medications - No data to display  Initial Impression / Assessment and Plan / UC Course  I have reviewed the triage vital signs and the nursing notes.  Pertinent labs & imaging results  that were available during my care of the patient were  reviewed by me and considered in my medical decision making (see chart for details).     Patient is a 35 year old female with history of obesity, not currently pregnant or breast-feeding per her report, who presents with acute left knee pain for 4 days.  Her examination is rather reassuring, she does have some pain with varus stress, however would be very unlikely to have an LCL alone injury.  She does not seem to have any instability in her posterior lateral corner.  Mechanism of injury also does not fit this type of injury.  She has no pain with meniscus testing which she found reassuring.  Discussed with patient that x-ray could be performed today, however she does not have any bony tenderness to suggest that there is a fracture and she has been able to bear weight for 4 days which is reassuring.  She would like a referral to orthopedics for possible consideration of MRI of her left knee.  She was given a prescription for meloxicam and advised to avoid other NSAIDs with use.  Can continue Tylenol while taking this.  Can also ice.  She was discharged home in stable condition. Final Clinical Impressions(s) / UC Diagnoses   Final diagnoses:  Acute pain of left knee     Discharge Instructions      We will have you follow up with the orthopedic doctor for your knee pain.  You can take the cam daily for your pain.  You can also use ice.  Do not take meloxicam with ibuprofen, Advil, Aleve.  You can take Tylenol with this. The number for the orthopedic doctor as listed on this paperwork, you can call them to schedule an appointment.     ED Prescriptions     Medication Sig Dispense Auth. Provider   meloxicam (MOBIC) 15 MG tablet Take 1 tablet (15 mg total) by mouth daily. 30 tablet Blayne Frankie, Solmon Ice, DO      PDMP not reviewed this encounter.   Unknown Jim, DO 05/20/21 1954

## 2021-05-20 NOTE — Discharge Instructions (Addendum)
We will have you follow up with the orthopedic doctor for your knee pain.  You can take the cam daily for your pain.  You can also use ice.  Do not take meloxicam with ibuprofen, Advil, Aleve.  You can take Tylenol with this. The number for the orthopedic doctor as listed on this paperwork, you can call them to schedule an appointment.

## 2021-06-06 ENCOUNTER — Ambulatory Visit: Payer: Medicaid Other | Admitting: Family Medicine

## 2021-06-16 ENCOUNTER — Other Ambulatory Visit: Payer: Self-pay | Admitting: Family Medicine

## 2021-07-04 ENCOUNTER — Other Ambulatory Visit: Payer: Self-pay

## 2021-07-04 ENCOUNTER — Encounter (HOSPITAL_COMMUNITY): Payer: Self-pay

## 2021-07-04 ENCOUNTER — Ambulatory Visit (HOSPITAL_COMMUNITY)
Admission: EM | Admit: 2021-07-04 | Discharge: 2021-07-04 | Disposition: A | Discharge status: Home / Self Care | Payer: Medicaid Other | Attending: Student | Admitting: Student

## 2021-07-04 DIAGNOSIS — S39012A Strain of muscle, fascia and tendon of lower back, initial encounter: Secondary | ICD-10-CM | POA: Diagnosis not present

## 2021-07-04 DIAGNOSIS — Z789 Other specified health status: Secondary | ICD-10-CM

## 2021-07-04 LAB — POCT URINALYSIS DIPSTICK, ED / UC
Bilirubin Urine: NEGATIVE
Glucose, UA: NEGATIVE mg/dL
Ketones, ur: NEGATIVE mg/dL
Leukocytes,Ua: NEGATIVE
Nitrite: NEGATIVE
Protein, ur: NEGATIVE mg/dL
Specific Gravity, Urine: 1.02 (ref 1.005–1.030)
Urobilinogen, UA: 0.2 mg/dL (ref 0.0–1.0)
pH: 5 (ref 5.0–8.0)

## 2021-07-04 LAB — POC URINE PREG, ED: Preg Test, Ur: NEGATIVE

## 2021-07-04 MED ORDER — TIZANIDINE HCL 2 MG PO TABS: 2.0000 mg | ORAL_TABLET | Freq: Three times a day (TID) | ORAL | 0 refills | Status: DC | PRN

## 2021-07-04 NOTE — ED Provider Notes (Signed)
[facility]    CSN: [id number] Arrival date & time: 07/04/21  1219      History   Chief Complaint Chief Complaint  Patient presents with   Back Pain   Possible Pregnancy    HPI Darlene Cruz is a 35 y.o. female presenting with constellation of symptoms.  Medical history morbid obesity.  Self-reported history of ovarian cyst, she is followed by GYN for this and next appointment is in 20 days but she states she cannot wait until then. States her period is 2 months late, she is now having lower back pain and some right shoulder pain.  Her period is irregular at baseline, she is concerned that she is pregnant however.  Denies other vaginal or urinary symptoms.  Also with right-sided lower back pain and right shoulder pain, she is right-handed and stands all day at work.  States the pain is worse after work.  She is concerned that she has gallbladder disease, though she denies right upper quadrant pain, nausea, vomit, diarrhea.  Spoke with this patient using language line.  HPI  Past Medical History:  Diagnosis Date   Abdominal cramping 06/05/2015   Dizziness 06/05/2015   Ear pain 06/05/2015   Morbid obesity (HCC)    Pelvic pain in female 03/12/2016   Weight gain 06/05/2015    Patient Active Problem List   Diagnosis Date Noted   Morbid obesity (HCC)    History of vitamin D deficiency 05/15/2019    Past Surgical History:  Procedure Laterality Date   CESAREAN SECTION     CESAREAN SECTION     KNEE SURGERY Left    TONSILLECTOMY AND ADENOIDECTOMY      OB History     Gravida  6   Para  1   Term  1   Preterm  0   AB  5   Living  1      SAB  5   IAB  0   Ectopic  0   Multiple  0   Live Births  1            Home Medications    Prior to Admission medications   Medication Sig Start Date End Date Taking? Authorizing Provider  tiZANidine (ZANAFLEX) 2 MG tablet Take 1 tablet (2 mg total) by mouth every 8 (eight) hours as needed for  muscle spasms. 07/04/21  Yes Rhys Martini, PA-C  acetaminophen (TYLENOL) 500 MG tablet Take 2 tablets (1,000 mg total) by mouth every 6 (six) hours as needed for headache. 08/25/17   Lizbeth Bark, FNP  Cholecalciferol (VITAMIN D PO) Take 1 tablet by mouth once a week. Every 5 days    [provider]  mirtazapine (REMERON) 15 MG tablet Take 15 mg by mouth at bedtime. 11/04/20   [provider]    Family History Family History  Problem Relation Age of Onset   Hypertension Mother    Diabetes Mother    Hyperlipidemia Mother     Social History Social History   Tobacco Use   Smoking status: Former    Packs/day: 0.20    Years: 5.00    Pack years: 1.00    Types: Cigarettes   Smokeless tobacco: Never   Tobacco comments:    quit July 2016   Substance Use Topics   Alcohol use: No    Alcohol/week: 0.0 standard drinks   Drug use: No     Allergies   Patient has no known allergies.  Review of Systems Review of Systems  Constitutional:  Negative for appetite change, chills, diaphoresis, fever and unexpected weight change.  HENT:  Negative for congestion, ear pain, sinus pressure, sinus pain, sneezing, sore throat and trouble swallowing.   Respiratory:  Negative for cough, chest tightness and shortness of breath.   Cardiovascular:  Negative for chest pain.  Gastrointestinal:  Negative for abdominal distention, abdominal pain, anal bleeding, blood in stool, constipation, diarrhea, nausea, rectal pain and vomiting.  Genitourinary:  Positive for menstrual problem. Negative for dysuria, flank pain, frequency and urgency.  Musculoskeletal:  Positive for back pain. Negative for myalgias.  Neurological:  Negative for dizziness, light-headedness and headaches.  All other systems reviewed and are negative.   Physical Exam Triage Vital Signs ED Triage Vitals  Enc Vitals Group     BP 07/04/21 1418 108/65     Pulse Rate 07/04/21 1418 64     Resp 07/04/21 1418  18     Temp 07/04/21 1418 97.7 F (36.5 C)     Temp Source 07/04/21 1418 Oral     SpO2 --      Weight --      Height --      Head Circumference --      Peak Flow --      Pain Score 07/04/21 1419 10     Pain Loc --      Pain Edu? --      Excl. in GC? --    No data found.  Updated Vital Signs BP 108/65 (BP Location: Right Arm)   Pulse 64   Temp 97.7 F (36.5 C) (Oral)   Resp 18   LMP 05/01/2021   Visual Acuity Right Eye Distance:   Left Eye Distance:   Bilateral Distance:    Right Eye Near:   Left Eye Near:    Bilateral Near:     Physical Exam Vitals reviewed.  Constitutional:      General: She is not in acute distress.    Appearance: Normal appearance. She is obese. She is not ill-appearing.  HENT:     Head: Normocephalic and atraumatic.     Mouth/Throat:     Mouth: Mucous membranes are moist.     Comments: Moist mucous membranes Eyes:     Extraocular Movements: Extraocular movements intact.     Pupils: Pupils are equal, round, and reactive to light.  Cardiovascular:     Rate and Rhythm: Normal rate and regular rhythm.     Heart sounds: Normal heart sounds.  Pulmonary:     Effort: Pulmonary effort is normal.     Breath sounds: Normal breath sounds. No wheezing, rhonchi or rales.  Abdominal:     General: Bowel sounds are normal. There is no distension.     Palpations: Abdomen is soft. There is no mass.     Tenderness: There is no abdominal tenderness. There is no right CVA tenderness, left CVA tenderness, guarding or rebound. Negative signs include Murphy's sign, Rovsing's sign and McBurney's sign.     Comments: No tenderness  Musculoskeletal:     Comments: Proximal right trapezius tenderness to palpation, pain elicited with abduction right arm.  Right-sided lumbar paraspinous muscle tenderness to palpation.  No midline spinous tenderness or deformity.  Strength and sensation grossly intact upper and lower extremities.  Skin:    General: Skin is warm.      Capillary Refill: Capillary refill takes less than 2 seconds.     Comments: Good skin turgor  Neurological:     General: No focal deficit present.     Mental Status: She is alert and oriented to person, place, and time.  Psychiatric:        Mood and Affect: Mood normal.        Behavior: Behavior normal.     UC Treatments / Results  Labs (all labs ordered are listed, but only abnormal results are displayed) Labs Reviewed  POCT URINALYSIS DIPSTICK, ED / UC - Abnormal; Notable for the following components:      Result Value   Hgb urine dipstick TRACE (*)    All other components within normal limits  POC URINE PREG, ED    EKG   Radiology No results found.  Procedures Procedures (including critical care time)  Medications Ordered in UC Medications - No data to display  Initial Impression / Assessment and Plan / UC Course  I have reviewed the triage vital signs and the nursing notes.  Pertinent labs & imaging results that were available during my care of the patient were reviewed by me and considered in my medical decision making (see chart for details).     This patient is a very pleasant 35 y.o. year old female presenting with lumbar strain.  No red flag symptoms.  Spoke with this patient using language line.  UA with trace blood, otherwise within normal limits, did not send culture.  Urine pregnancy negative.  Back pain is reproducible, as is right trapezius pain.  No abdominal pain to palpation.  Discussed that the etiology of pain is musculoskeletal in nature, patient is adamant that she disagrees with me and that the pain is her gallbladder.  Extensively discussed my differential and why I do not believe that her gallbladder is at fault, she continues to disagree, and so at this point I recommend that she had to the emergency department for further evaluation.  Patient also disagrees with this, and states that she prefers to go home.  I did send a muscle relaxer, but again  recommended she go to the emergency department given her concerns.  Spoke with this patient using language line. ED return precautions discussed. Patient verbalizes understanding and agreement.  .   Final Clinical Impressions(s) / UC Diagnoses   Final diagnoses:  Strain of lumbar region, initial encounter  Language barrier     Discharge Instructions      -You are not pregnant and you do not have a kidney infection -I believe that you have a muscular strain.  For this, you can take the muscle relaxer up to 3 times daily. -If you are still concerned that you have a problem with your kidneys, bladder, gallbladder, head to the emergency department.  We do not have imaging that can further evaluate the gallbladder at this urgent care. -Follow-up with your primary care and gynecologist for your chronic medical issues including ovarian cyst and fatigue.     ED Prescriptions     Medication Sig Dispense Auth. Provider   tiZANidine (ZANAFLEX) 2 MG tablet Take 1 tablet (2 mg total) by mouth every 8 (eight) hours as needed for muscle spasms. 21 tablet Rhys Martini, PA-C      PDMP not reviewed this encounter.   Rhys Martini, PA-C 07/04/21 1527

## 2021-07-04 NOTE — Discharge Instructions (Addendum)
-  You are not pregnant and you do not have a kidney infection -I believe that you have a muscular strain.  For this, you can take the muscle relaxer up to 3 times daily. -If you are still concerned that you have a problem with your kidneys, bladder, gallbladder, head to the emergency department.  We do not have imaging that can further evaluate the gallbladder at this urgent care. -Follow-up with your primary care and gynecologist for your chronic medical issues including ovarian cyst and fatigue.

## 2021-07-04 NOTE — ED Triage Notes (Signed)
Pt states she is 2 months late on her menstrual cycle. Pt c/o rt lower back and rt shoulder pain x3 days. Denies injury.

## 2021-10-13 ENCOUNTER — Other Ambulatory Visit: Payer: Self-pay | Admitting: Obstetrics and Gynecology

## 2021-10-16 IMAGING — DX DG CHEST 2V
2 series · 2 of 2 positions shown · non-contrast
Comparison: None.

CLINICAL DATA: Cough for 1 week

EXAM:
CHEST - 2 VIEW

[chest pa]
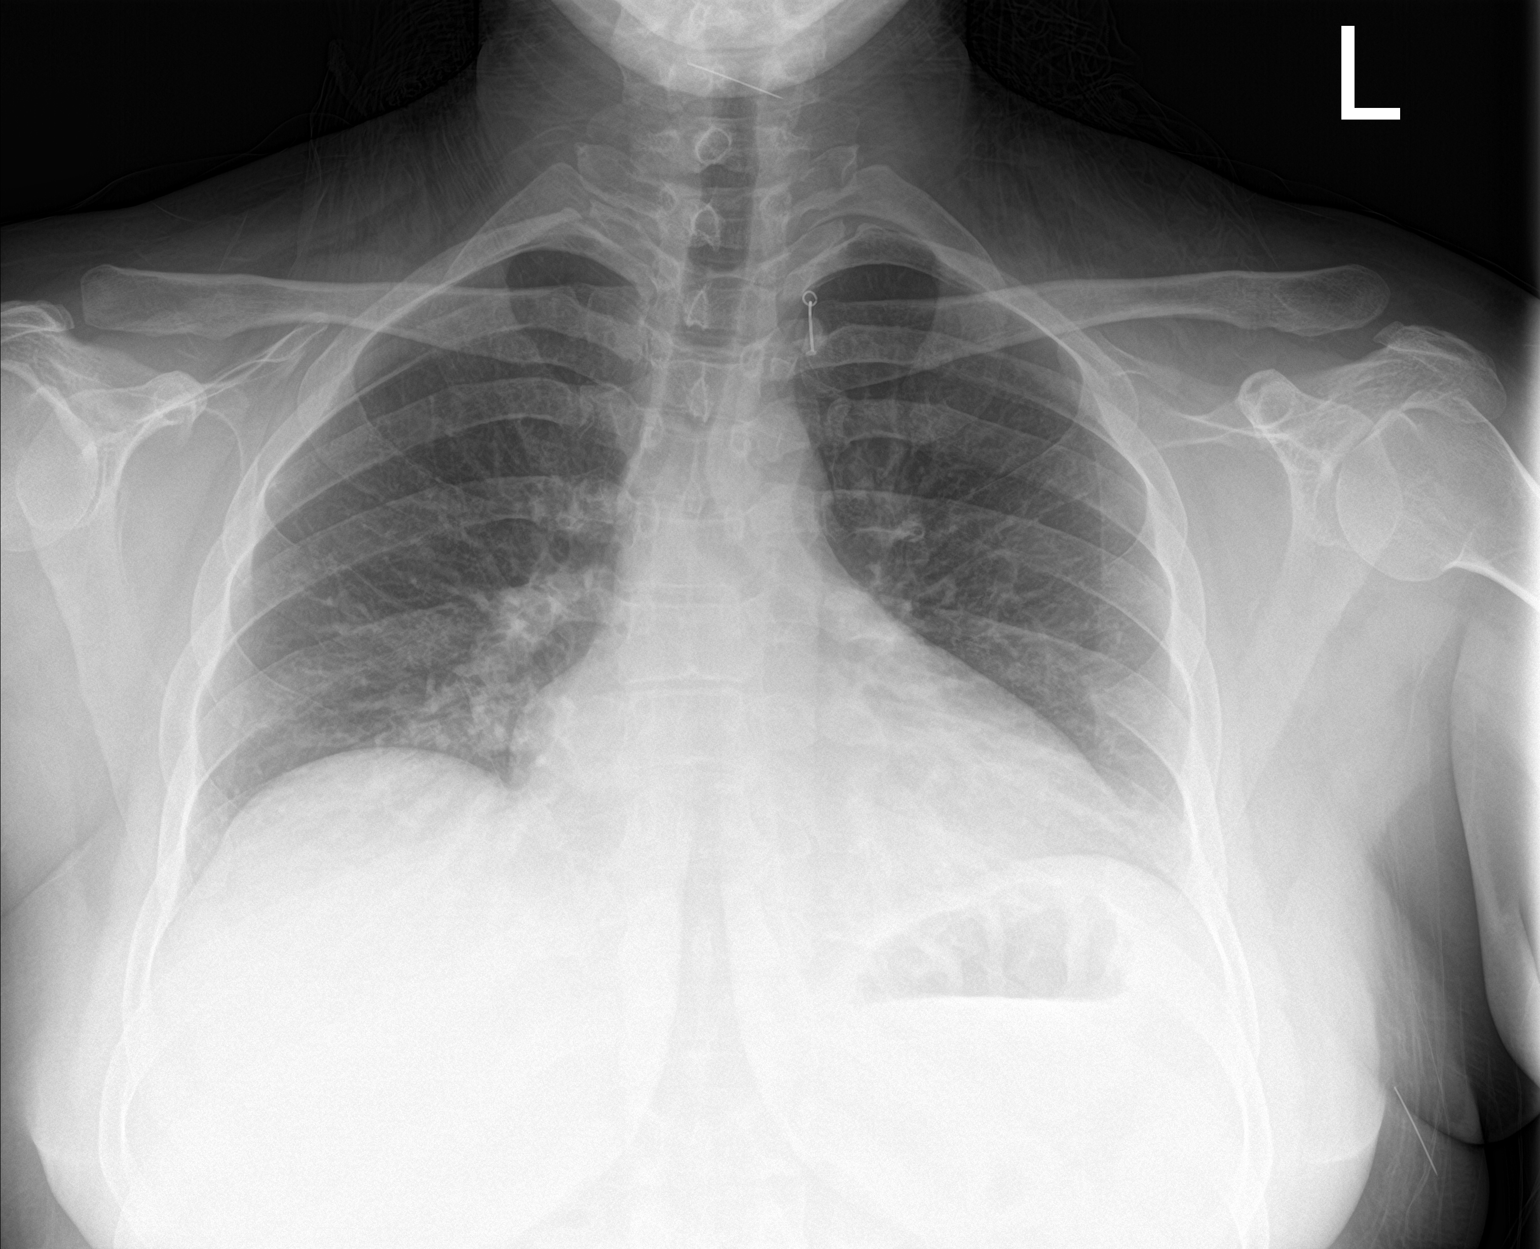

[chest lat]
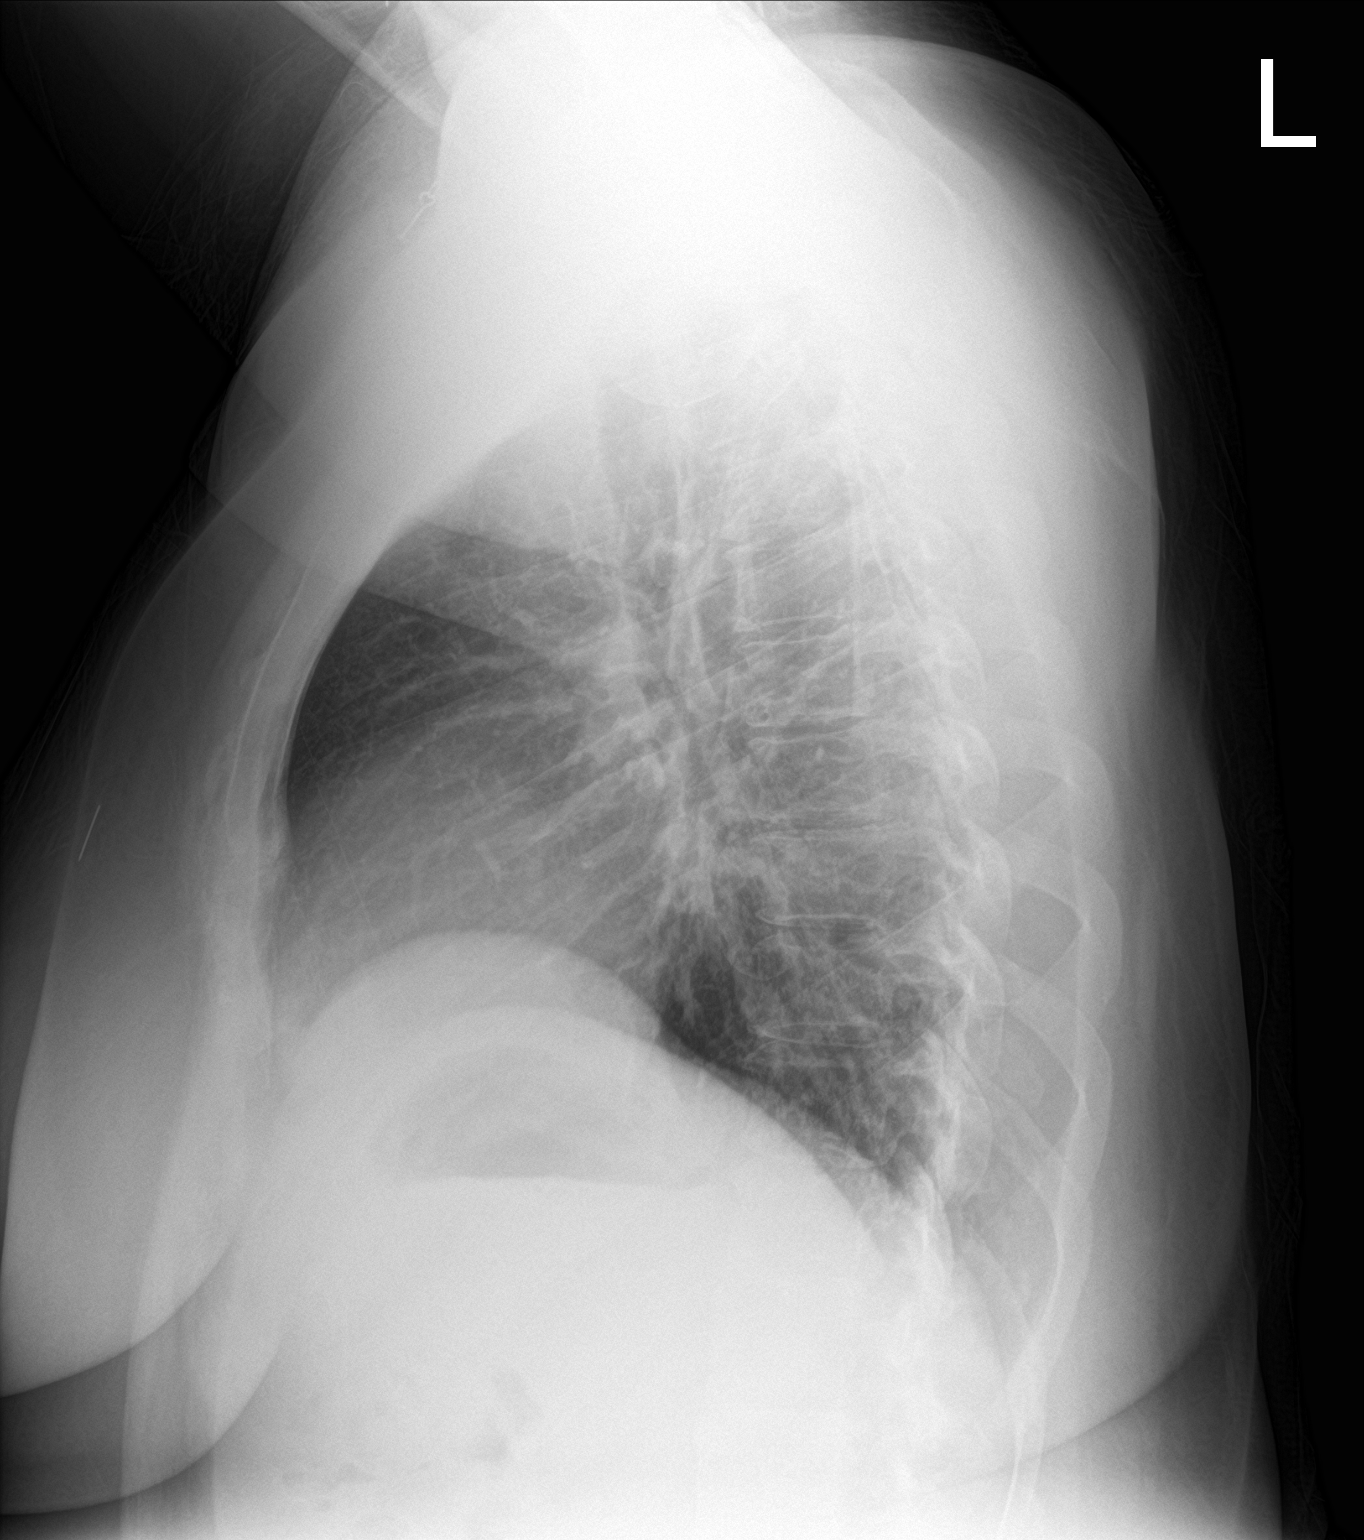

[2 of 2 positions shown; findings below may reference images not displayed]

FINDINGS: Cardiac shadow is at the upper limits of normal in size. The lungs
are clear bilaterally. No effusion is noted. No bony abnormality is
seen. Metallic foreign bodies are identified which appear to be
extrinsic to the patient. Clinical correlation is recommended.
IMPRESSION: No acute intrathoracic abnormality noted.

Metallic foreign bodies which appear to be extrinsic to the patient.

## 2021-10-20 ENCOUNTER — Encounter (HOSPITAL_BASED_OUTPATIENT_CLINIC_OR_DEPARTMENT_OTHER): Payer: Self-pay | Admitting: Obstetrics and Gynecology

## 2021-10-23 ENCOUNTER — Encounter (HOSPITAL_BASED_OUTPATIENT_CLINIC_OR_DEPARTMENT_OTHER): Payer: Self-pay | Admitting: Obstetrics and Gynecology

## 2021-10-23 ENCOUNTER — Other Ambulatory Visit: Payer: Self-pay

## 2021-10-23 NOTE — Progress Notes (Signed)
Spoke w/ via phone for pre-op interview--- pt thru [place] interpreter ID# [phone] Lab needs dos----   cbc, bmp, t&s, urine preg             Lab results------ no COVID test -----patient states asymptomatic no test needed Arrive at ------- 1000 on 10-24-2021 NPO after MN NO Solid Food.  Clear liquids from MN until--- 0900  (water, gatorade, tea, black coffee only (no cream/ milk products) Med rec completed Medications to take morning of surgery ----- none Diabetic medication ----- n/a Patient instructed no nail polish to be worn day of surgery Patient instructed to bring photo id and insurance card day of surgery Patient aware to have Driver (ride ) / caregiver  for 24 hours after surgery -- husband, abdlahadi and due to pt culture pt request her female friend be waiting in area with her husband , pt aware they will not be allowed in pre-op  Patient Special Instructions ----- n/a  Pre-Op special Istructions ----- requested Arabic interpreter via email to [place] interpreting .  Have already received confirmation , placed copy with chart Patient verbalized understanding of instructions that were given at this phone interview. Patient denies shortness of breath, chest pain, fever, cough at this phone interview.

## 2021-10-24 ENCOUNTER — Encounter (HOSPITAL_BASED_OUTPATIENT_CLINIC_OR_DEPARTMENT_OTHER)
Admission: RE | Disposition: A | Discharge status: Home / Self Care | Payer: Self-pay | Source: Home / Self Care | Attending: Obstetrics and Gynecology

## 2021-10-24 ENCOUNTER — Ambulatory Visit (HOSPITAL_BASED_OUTPATIENT_CLINIC_OR_DEPARTMENT_OTHER): Payer: Medicaid Other | Admitting: Anesthesiology

## 2021-10-24 ENCOUNTER — Other Ambulatory Visit: Payer: Self-pay

## 2021-10-24 ENCOUNTER — Encounter (HOSPITAL_BASED_OUTPATIENT_CLINIC_OR_DEPARTMENT_OTHER): Payer: Self-pay | Admitting: Obstetrics and Gynecology

## 2021-10-24 ENCOUNTER — Ambulatory Visit (HOSPITAL_BASED_OUTPATIENT_CLINIC_OR_DEPARTMENT_OTHER)
Admission: RE | Admit: 2021-10-24 | Discharge: 2021-10-24 | Disposition: A | Discharge status: Home / Self Care | Payer: Medicaid Other | Attending: Obstetrics and Gynecology | Admitting: Obstetrics and Gynecology

## 2021-10-24 DIAGNOSIS — N809 Endometriosis, unspecified: Secondary | ICD-10-CM | POA: Diagnosis not present

## 2021-10-24 DIAGNOSIS — Z87891 Personal history of nicotine dependence: Secondary | ICD-10-CM | POA: Diagnosis not present

## 2021-10-24 DIAGNOSIS — N736 Female pelvic peritoneal adhesions (postinfective): Secondary | ICD-10-CM | POA: Diagnosis not present

## 2021-10-24 DIAGNOSIS — N805 Endometriosis of intestine, unspecified: Secondary | ICD-10-CM | POA: Diagnosis not present

## 2021-10-24 DIAGNOSIS — N83209 Unspecified ovarian cyst, unspecified side: Secondary | ICD-10-CM | POA: Insufficient documentation

## 2021-10-24 DIAGNOSIS — R102 Pelvic and perineal pain: Secondary | ICD-10-CM | POA: Diagnosis present

## 2021-10-24 DIAGNOSIS — Z6841 Body Mass Index (BMI) 40.0 and over, adult: Secondary | ICD-10-CM | POA: Diagnosis not present

## 2021-10-24 HISTORY — PX: LAPAROSCOPIC OVARIAN CYSTECTOMY: SHX6248

## 2021-10-24 LAB — CBC
HCT: 38.7 % (ref 36.0–46.0)
Hemoglobin: 12.3 g/dL (ref 12.0–15.0)
MCH: 26.4 pg (ref 26.0–34.0)
MCHC: 31.8 g/dL (ref 30.0–36.0)
MCV: 83 fL (ref 80.0–100.0)
Platelets: 274 10*3/uL (ref 150–400)
RBC: 4.66 MIL/uL (ref 3.87–5.11)
RDW: 15.1 % (ref 11.5–15.5)
WBC: 7.5 10*3/uL (ref 4.0–10.5)
nRBC: 0 % (ref 0.0–0.2)

## 2021-10-24 LAB — BASIC METABOLIC PANEL
Anion gap: 7 (ref 5–15)
BUN: 10 mg/dL (ref 6–20)
CO2: 23 mmol/L (ref 22–32)
Calcium: 9.1 mg/dL (ref 8.9–10.3)
Chloride: 108 mmol/L (ref 98–111)
Creatinine, Ser: 0.68 mg/dL (ref 0.44–1.00)
GFR, Estimated: >60 mL/min (ref >60)
Glucose, Bld: 82 mg/dL (ref 70–99)
Potassium: 3.8 mmol/L (ref 3.5–5.1)
Sodium: 138 mmol/L (ref 135–145)

## 2021-10-24 LAB — TYPE AND SCREEN
ABO/RH(D): A POS
Antibody Screen: NEGATIVE

## 2021-10-24 LAB — ABO/RH: ABO/RH(D): A POS

## 2021-10-24 LAB — POCT PREGNANCY, URINE: Preg Test, Ur: NEGATIVE

## 2021-10-24 SURGERY — EXCISION, CYST, OVARY, LAPAROSCOPIC
Anesthesia: General | Site: Abdomen | Laterality: Bilateral

## 2021-10-24 MED ORDER — SUGAMMADEX SODIUM 500 MG/5ML IV SOLN
INTRAVENOUS | Status: DC | PRN
  Administered 2021-10-24: 250 mg via INTRAVENOUS

## 2021-10-24 MED ORDER — DEXAMETHASONE SODIUM PHOSPHATE 10 MG/ML IJ SOLN
INTRAMUSCULAR | Status: AC
  Filled 2021-10-24: qty 1

## 2021-10-24 MED ORDER — ACETAMINOPHEN 500 MG PO TABS
ORAL_TABLET | ORAL | Status: AC
  Filled 2021-10-24: qty 2

## 2021-10-24 MED ORDER — PROPOFOL 10 MG/ML IV BOLUS
INTRAVENOUS | Status: DC | PRN
  Administered 2021-10-24: 160 mg via INTRAVENOUS

## 2021-10-24 MED ORDER — PROMETHAZINE HCL 25 MG/ML IJ SOLN
INTRAMUSCULAR | Status: AC
  Filled 2021-10-24: qty 1

## 2021-10-24 MED ORDER — CEFAZOLIN SODIUM-DEXTROSE 2-4 GM/100ML-% IV SOLN: 2.0000 g | INTRAVENOUS | Status: DC

## 2021-10-24 MED ORDER — ROCURONIUM BROMIDE 10 MG/ML (PF) SYRINGE
PREFILLED_SYRINGE | INTRAVENOUS | Status: AC
  Filled 2021-10-24: qty 10

## 2021-10-24 MED ORDER — CEFAZOLIN SODIUM-DEXTROSE 1-4 GM/50ML-% IV SOLN
INTRAVENOUS | Status: AC
  Filled 2021-10-24: qty 50

## 2021-10-24 MED ORDER — ROCURONIUM BROMIDE 10 MG/ML (PF) SYRINGE
PREFILLED_SYRINGE | INTRAVENOUS | Status: DC | PRN
  Administered 2021-10-24: 60 mg via INTRAVENOUS

## 2021-10-24 MED ORDER — POVIDONE-IODINE 10 % EX SWAB: 2.0000 "application " | Freq: Once | CUTANEOUS | Status: DC

## 2021-10-24 MED ORDER — CELECOXIB 200 MG PO CAPS
ORAL_CAPSULE | ORAL | Status: AC
  Filled 2021-10-24: qty 1

## 2021-10-24 MED ORDER — LACTATED RINGERS IV SOLN: INTRAVENOUS | Status: DC

## 2021-10-24 MED ORDER — ACETAMINOPHEN 500 MG PO TABS
1000.0000 mg | ORAL_TABLET | Freq: Once | ORAL | Status: AC
  Administered 2021-10-24: 1000 mg via ORAL

## 2021-10-24 MED ORDER — IBUPROFEN 600 MG PO TABS: 600.0000 mg | ORAL_TABLET | Freq: Four times a day (QID) | ORAL | 1 refills | Status: DC | PRN

## 2021-10-24 MED ORDER — OXYCODONE-ACETAMINOPHEN 5-325 MG PO TABS: 1.0000 | ORAL_TABLET | Freq: Four times a day (QID) | ORAL | 0 refills | Status: DC | PRN

## 2021-10-24 MED ORDER — FENTANYL CITRATE (PF) 250 MCG/5ML IJ SOLN
INTRAMUSCULAR | Status: DC | PRN
  Administered 2021-10-24: 100 ug via INTRAVENOUS

## 2021-10-24 MED ORDER — ONDANSETRON HCL 4 MG/2ML IJ SOLN
INTRAMUSCULAR | Status: DC | PRN
  Administered 2021-10-24: 4 mg via INTRAVENOUS

## 2021-10-24 MED ORDER — DEXAMETHASONE SODIUM PHOSPHATE 10 MG/ML IJ SOLN
INTRAMUSCULAR | Status: DC | PRN
  Administered 2021-10-24: 10 mg via INTRAVENOUS

## 2021-10-24 MED ORDER — FENTANYL CITRATE (PF) 100 MCG/2ML IJ SOLN: 25.0000 ug | INTRAMUSCULAR | Status: DC | PRN

## 2021-10-24 MED ORDER — OXYCODONE HCL 5 MG/5ML PO SOLN: 5.0000 mg | Freq: Once | ORAL | Status: DC | PRN

## 2021-10-24 MED ORDER — ONDANSETRON HCL 4 MG/2ML IJ SOLN
INTRAMUSCULAR | Status: AC
  Filled 2021-10-24: qty 2

## 2021-10-24 MED ORDER — LIDOCAINE HCL (PF) 2 % IJ SOLN
INTRAMUSCULAR | Status: AC
  Filled 2021-10-24: qty 5

## 2021-10-24 MED ORDER — PROMETHAZINE HCL 25 MG/ML IJ SOLN
6.2500 mg | INTRAMUSCULAR | Status: DC | PRN
  Administered 2021-10-24: 6.25 mg via INTRAVENOUS

## 2021-10-24 MED ORDER — CEFAZOLIN SODIUM-DEXTROSE 2-4 GM/100ML-% IV SOLN
INTRAVENOUS | Status: AC
  Filled 2021-10-24: qty 100

## 2021-10-24 MED ORDER — LIDOCAINE 20MG/ML (2%) 15 ML SYRINGE OPTIME
INTRAMUSCULAR | Status: DC | PRN
  Administered 2021-10-24: 1.5 mg/kg/h via INTRAVENOUS

## 2021-10-24 MED ORDER — CEFAZOLIN IN SODIUM CHLORIDE 3-0.9 GM/100ML-% IV SOLN
3.0000 g | INTRAVENOUS | Status: AC
  Administered 2021-10-24: 3 g via INTRAVENOUS

## 2021-10-24 MED ORDER — FENTANYL CITRATE (PF) 100 MCG/2ML IJ SOLN
INTRAMUSCULAR | Status: AC
  Filled 2021-10-24: qty 2

## 2021-10-24 MED ORDER — LIDOCAINE HCL 2 % IJ SOLN
INTRAMUSCULAR | Status: AC
  Filled 2021-10-24: qty 20

## 2021-10-24 MED ORDER — PROPOFOL 10 MG/ML IV BOLUS
INTRAVENOUS | Status: AC
  Filled 2021-10-24: qty 20

## 2021-10-24 MED ORDER — OXYCODONE HCL 5 MG PO TABS: 5.0000 mg | ORAL_TABLET | Freq: Once | ORAL | Status: DC | PRN

## 2021-10-24 MED ORDER — SUGAMMADEX SODIUM 500 MG/5ML IV SOLN
INTRAVENOUS | Status: AC
  Filled 2021-10-24: qty 5

## 2021-10-24 MED ORDER — MIDAZOLAM HCL 2 MG/2ML IJ SOLN
INTRAMUSCULAR | Status: AC
  Filled 2021-10-24: qty 2

## 2021-10-24 MED ORDER — MIDAZOLAM HCL 5 MG/5ML IJ SOLN
INTRAMUSCULAR | Status: DC | PRN
  Administered 2021-10-24: 2 mg via INTRAVENOUS

## 2021-10-24 MED ORDER — SODIUM CHLORIDE 0.9 % IR SOLN
Status: DC | PRN
  Administered 2021-10-24: 3000 mL

## 2021-10-24 MED ORDER — CELECOXIB 200 MG PO CAPS
200.0000 mg | ORAL_CAPSULE | Freq: Once | ORAL | Status: AC
  Administered 2021-10-24: 200 mg via ORAL

## 2021-10-24 MED ORDER — BUPIVACAINE HCL (PF) 0.25 % IJ SOLN
INTRAMUSCULAR | Status: DC | PRN
  Administered 2021-10-24: 26 mL

## 2021-10-24 SURGICAL SUPPLY — 41 items
ADH SKN CLS APL DERMABOND .7 (GAUZE/BANDAGES/DRESSINGS) ×3
APL PRP STRL LF DISP 70% ISPRP (MISCELLANEOUS)
APL SRG 38 LTWT LNG FL B (MISCELLANEOUS)
APPLICATOR ARISTA FLEXITIP XL (MISCELLANEOUS) IMPLANT
BAG SPEC RTRVL LRG 6X4 10 (ENDOMECHANICALS)
CABLE HIGH FREQUENCY MONO STRZ (ELECTRODE) IMPLANT
CHLORAPREP W/TINT 26 (MISCELLANEOUS) IMPLANT
COVER MAYO STAND STRL (DRAPES) ×2 IMPLANT
DERMABOND ADVANCED (GAUZE/BANDAGES/DRESSINGS) ×3
DERMABOND ADVANCED .7 DNX12 (GAUZE/BANDAGES/DRESSINGS) ×1 IMPLANT
DILATOR CANAL MILEX (MISCELLANEOUS) IMPLANT
DRSG OPSITE POSTOP 3X4 (GAUZE/BANDAGES/DRESSINGS) ×2 IMPLANT
DURAPREP 26ML APPLICATOR (WOUND CARE) ×2 IMPLANT
GAUZE 4X4 16PLY ~~LOC~~+RFID DBL (SPONGE) ×2 IMPLANT
GLOVE SURG ENC MOIS LTX SZ7.5 (GLOVE) ×2 IMPLANT
GLOVE SURG UNDER POLY LF SZ7 (GLOVE) ×4 IMPLANT
GLOVE SURG UNDER POLY LF SZ7.5 (GLOVE) ×4 IMPLANT
GOWN STRL REUS W/TWL LRG LVL3 (GOWN DISPOSABLE) ×2 IMPLANT
HEMOSTAT ARISTA ABSORB 3G PWDR (HEMOSTASIS) IMPLANT
HIBICLENS CHG 4% 4OZ BTL (MISCELLANEOUS) ×2 IMPLANT
KIT TURNOVER CYSTO (KITS) ×2 IMPLANT
LIGASURE VESSEL 5MM BLUNT TIP (ELECTROSURGICAL) ×1 IMPLANT
NEEDLE INSUFFLATION 120MM (ENDOMECHANICALS) ×2 IMPLANT
NS IRRIG 1000ML POUR BTL (IV SOLUTION) IMPLANT
PACK LAPAROSCOPY BASIN (CUSTOM PROCEDURE TRAY) ×2 IMPLANT
PACK TRENDGUARD 450 HYBRID PRO (MISCELLANEOUS) ×1 IMPLANT
POUCH SPECIMEN RETRIEVAL 10MM (ENDOMECHANICALS) IMPLANT
SET SUCTION IRRIG HYDROSURG (IRRIGATION / IRRIGATOR) ×1 IMPLANT
SET TUBE SMOKE EVAC HIGH FLOW (TUBING) ×2 IMPLANT
SUT MNCRL AB 3-0 PS2 27 (SUTURE) ×2 IMPLANT
SUT VIC AB 4-0 PS2 18 (SUTURE) IMPLANT
SUT VICRYL 0 ENDOLOOP (SUTURE) IMPLANT
SUT VICRYL 0 TIES 12 18 (SUTURE) IMPLANT
SUT VICRYL 0 UR6 27IN ABS (SUTURE) ×2 IMPLANT
SYR 50ML LL SCALE MARK (SYRINGE) ×2 IMPLANT
TOWEL OR 17X26 10 PK STRL BLUE (TOWEL DISPOSABLE) ×2 IMPLANT
TRAY FOLEY W/BAG SLVR 14FR LF (SET/KITS/TRAYS/PACK) ×2 IMPLANT
TRENDGUARD 450 HYBRID PRO PACK (MISCELLANEOUS) ×2
TROCAR BLADELESS OPT 5 100 (ENDOMECHANICALS) ×2 IMPLANT
TROCAR XCEL NON-BLD 11X100MML (ENDOMECHANICALS) ×2 IMPLANT
WARMER LAPAROSCOPE (MISCELLANEOUS) ×2 IMPLANT

## 2021-10-24 NOTE — Anesthesia Postprocedure Evaluation (Signed)
Anesthesia Post Note  Patient: Decklyn Jamal Alhaj Hamad  Procedure(s) Performed: ATTEMPTED LAPAROSCOPIC OVARIAN CYSTECTOMY/DIAGNOSTIC LAPROSCOPY/ PERITONEAL BIOPSIES (Bilateral: Abdomen)     Patient location during evaluation: PACU Anesthesia Type: General Level of consciousness: awake and alert Pain management: pain level controlled Vital Signs Assessment: post-procedure vital signs reviewed and stable Respiratory status: spontaneous breathing, nonlabored ventilation and respiratory function stable Cardiovascular status: stable and blood pressure returned to baseline Anesthetic complications: no   No notable events documented.  Last Vitals:  Vitals:   10/24/21 1415 10/24/21 1430  BP: 107/60 (!) 104/54  Pulse: 60 60  Resp: 17 11  Temp:    SpO2: 99% 98%    Last Pain:  Vitals:   10/24/21 1415  TempSrc:   PainSc: 0-No pain                 Beryle Lathe

## 2021-10-24 NOTE — Op Note (Signed)
Preop Diagnosis: 1.PELVIC PAIN 2.OVARIAN CYST   Postop Diagnosis: 1.PELVIC PAIN 2.OVARIAN CYST/ENDOMETRIOMA 3.ENDOMETRIOSIS   Procedure: 1.DIAGNOSTIC LAPAROSCOPY 2.PERITONEAL AND UTERINE SEROSAL BIOPSIES  Anesthesia: General  Anesthesiologist: Beryle Lathe, MD   Attending: Osborn Coho, MD   Assistant:  Essie Hart, MD  Findings: Endometriosis Pelvic Adhesions with mostly Obliterated Posterior Cul-De Sac Endometriosis in right adnexa and on bowel Likely left endometrioma encased in adhesions Endometriotic implants on bowel  Pathology: 1.Pelvic Washings 2.Right anterior cul-de sac peritoneal biopsies x 2 3.Uterine fundal serosal biopsies x 2  Fluids: 700 cc  UOP: 500 cc  EBL: 5 cc  Complications: None  Procedure:  The patient was taken to the operating room after the risks, benefits, alternatives, complications, treatment options, and expected outcomes were discussed with the patient. The patient verbalized understanding, the patient concurred with the proposed plan and consent signed and witnessed. The patient was taken to the Operating Room and identified as Darlene Cruz [facility] and the procedure verified as Diagnostic Laparoscopy and Cystectomy.  The patient was placed under general anesthesia per anesthesia staff, the patient was placed in modified dorsal lithotomy position and was prepped, draped, and catheterized in the normal, sterile fashion.  A Time Out was held and the above information confirmed.  The cervix was visualized and an intrauterine manipulator was placed.  A 10 mm umbilical incision was then performed. Veress needle was passed and pneumoperitoneum was established. A 10 mm trocar was advanced into the intraabdominal cavity, the laparoscope was introduced and findings as noted above.  Patient was placed in trendelenburg and marcaine injected in the LLQ and a 5 mm incision was made and 5 mm trocar advanced into the intraabdominal cavity.  The same  was done in the RLQ.  Findings and biopsies as noted above.  Right and left lower quadrant trocars were removed under direct visualization.  Pneumoperitoneum was relieved and umbilical trocar removed under direct visualization.  The umbilical fascia was repaired with 0 vicryl.  The 10 mm skin incision was repaired with 3-0 monocryl via a subcuticular stitch and dermabond applied to all incisions.    Sponge, instrument, lap and needle counts were correct.  The patient tolerated the procedure well and was awaiting extubation and transfer to the recovery room.   An assistant was required due to the complexity of the anatomy.

## 2021-10-24 NOTE — Anesthesia Procedure Notes (Signed)
Procedure Name: Intubation Date/Time: 10/24/2021 12:17 PM Performed by: Shelby Peltz D, CRNA Pre-anesthesia Checklist: Patient identified, Emergency Drugs available, Suction available and Patient being monitored Patient Re-evaluated:Patient Re-evaluated prior to induction Oxygen Delivery Method: Circle system utilized Preoxygenation: Pre-oxygenation with 100% oxygen Induction Type: IV induction Ventilation: Mask ventilation without difficulty Laryngoscope Size: Mac and 3 Grade View: Grade II Tube type: Oral Tube size: 7.0 mm Number of attempts: 1 Airway Equipment and Method: Stylet and Oral airway Placement Confirmation: ETT inserted through vocal cords under direct vision, positive ETCO2 and breath sounds checked- equal and bilateral Secured at: 21 cm Tube secured with: Tape Dental Injury: Teeth and Oropharynx as per pre-operative assessment

## 2021-10-24 NOTE — H&P (Signed)
Darlene Cruz is an 36 y.o. female. Pt well known to me. She has been experiencing chronic pelvic pain and has a persistent cyst.  She presents for dx laparoscopy and cystectomy if cyst remains.  She understands cyst may no longer be there.  Pain has been moderately controlled with norethindrone acetate.  Pertinent Gynecological History: H/o regular cycles with pain LMP last week very light   Patient's last menstrual period was 10/17/2021 (approximate).    Past Medical History:  Diagnosis Date   Pelvic pain in female 03/12/2016    Past Surgical History:  Procedure Laterality Date   CESAREAN SECTION  2011   approx   DILATION AND CURETTAGE OF UTERUS  2009   missed ab   KNEE SURGERY Left 2010   TONSILLECTOMY AND ADENOIDECTOMY     age 59    Family History  Problem Relation Age of Onset   Hypertension Mother    Diabetes Mother    Hyperlipidemia Mother     Social History:  reports that she has quit smoking. Her smoking use included cigarettes. She has a 1.00 pack-year smoking history. She has never used smokeless tobacco. She reports that she does not drink alcohol and does not use drugs.  Allergies: No Known Allergies  Medications Prior to Admission  Medication Sig Dispense Refill Last Dose   Cholecalciferol (VITAMIN D PO) Take 1 tablet by mouth once a week. Every 5 days   10/23/2021   ibuprofen (ADVIL) 200 MG tablet Take 200 mg by mouth every 6 (six) hours as needed.   10/23/2021   mirtazapine (REMERON) 15 MG tablet Take 15 mg by mouth at bedtime.   10/23/2021   norethindrone (AYGESTIN) 5 MG tablet Take 10 mg by mouth daily.   10/23/2021   tiZANidine (ZANAFLEX) 2 MG tablet Take 1 tablet (2 mg total) by mouth every 8 (eight) hours as needed for muscle spasms. 21 tablet 0 10/23/2021    Review of Systems  Blood pressure 108/61, pulse 61, temperature 98.8 F (37.1 C), temperature source Oral, resp. rate 20, height 5\' 2"  (1.575 m), weight 124.7 kg, last menstrual period  10/17/2021, SpO2 100 %. Physical Exam  Results for orders placed or performed during the hospital encounter of 10/24/21 (from the past 24 hour(s))  Pregnancy, urine POC     Status: None   Collection Time: 10/24/21 10:28 AM  Result Value Ref Range   Preg Test, Ur NEGATIVE NEGATIVE  Type and screen     Status: None (Preliminary result)   Collection Time: 10/24/21 10:36 AM  Result Value Ref Range   ABO/RH(D) PENDING    Antibody Screen PENDING    Sample Expiration      10/27/2021,2359 Performed at [facility], [address], [place], [place]   CBC     Status: None   Collection Time: 10/24/21 11:00 AM  Result Value Ref Range   WBC 7.5 4.0 - 10.5 K/uL   RBC 4.66 3.87 - 5.11 MIL/uL   Hemoglobin 12.3 12.0 - 15.0 g/dL   HCT 12/22/21 60.4 - 54.0 %   MCV 83.0 80.0 - 100.0 fL   MCH 26.4 26.0 - 34.0 pg   MCHC 31.8 30.0 - 36.0 g/dL   RDW 98.1 19.1 - 47.8 %   Platelets 274 150 - 400 K/uL   nRBC 0.0 0.0 - 0.2 %    No results found.  Assessment/Plan[age] with persistent cyst and chronic pelvic pain presenting for dx laparoscopy and cystectomy.  Risks benefits alternatives reviewed including but not limited to bleeding infection and injury.  Questions answered and consent signed and witnessed.  Purcell Nails 10/24/2021, 11:59 AM

## 2021-10-24 NOTE — Transfer of Care (Signed)
Immediate Anesthesia Transfer of Care Note  Patient: Remee Jamal Alhaj Hamad  Procedure(s) Performed: ATTEMPTED LAPAROSCOPIC OVARIAN CYSTECTOMY/DIAGNOSTIC LAPROSCOPY/ PERITONEAL BIOPSIES (Bilateral: Abdomen)  Patient Location: PACU  Anesthesia Type:General  Level of Consciousness: awake, alert  and oriented  Airway & Oxygen Therapy: Patient Spontanous Breathing and Patient connected to nasal cannula oxygen  Post-op Assessment: Report given to RN and Post -op Vital signs reviewed and stable  Post vital signs: Reviewed and stable  Last Vitals:  Vitals Value Taken Time  BP 113/71 10/24/21 1339  Temp    Pulse 66 10/24/21 1341  Resp 15 10/24/21 1341  SpO2 100 % 10/24/21 1341  Vitals shown include unvalidated device data.  Last Pain:  Vitals:   10/24/21 1053  TempSrc: Oral  PainSc: 3       Patients Stated Pain Goal: 3 (10/24/21 1053)  Complications: No notable events documented.

## 2021-10-24 NOTE — Discharge Instructions (Addendum)
NO TYLENOL PRODUCTS UNTIL AFTER 5:00 PM TODAY.      Post Anesthesia Home Care Instructions  Activity: Get plenty of rest for the remainder of the day. A responsible individual must stay with you for 24 hours following the procedure.  For the next 24 hours, DO NOT: -Drive a car -Paediatric nurse -Drink alcoholic beverages -Take any medication unless instructed by your physician -Make any legal decisions or sign important papers.  Meals: Start with liquid foods such as gelatin or soup. Progress to regular foods as tolerated. Avoid greasy, spicy, heavy foods. If nausea and/or vomiting occur, drink only clear liquids until the nausea and/or vomiting subsides. Call your physician if vomiting continues.  Special Instructions/Symptoms: Your throat may feel dry or sore from the anesthesia or the breathing tube placed in your throat during surgery. If this causes discomfort, gargle with warm salt water. The discomfort should disappear within 24 hours.

## 2021-10-24 NOTE — Anesthesia Preprocedure Evaluation (Addendum)
Anesthesia Evaluation  Patient identified by MRN, date of birth, ID band Patient awake    Reviewed: Allergy & Precautions, NPO status , Patient's Chart, lab work & pertinent test results  History of Anesthesia Complications Negative for: history of anesthetic complications  Airway Mallampati: II  TM Distance: >3 FB Neck ROM: Full    Dental  (+) Dental Advisory Given, Teeth Intact   Pulmonary former smoker,    Pulmonary exam normal        Cardiovascular negative cardio ROS Normal cardiovascular exam     Neuro/Psych negative neurological ROS  negative psych ROS   GI/Hepatic negative GI ROS, Neg liver ROS,   Endo/Other  Morbid obesity  Renal/GU negative Renal ROS     Musculoskeletal negative musculoskeletal ROS (+)   Abdominal   Peds  Hematology negative hematology ROS (+)   Anesthesia Other Findings   Reproductive/Obstetrics                            Anesthesia Physical Anesthesia Plan  ASA: 3  Anesthesia Plan: General   Post-op Pain Management: Tylenol PO (pre-op) and Celebrex PO (pre-op)   Induction: Intravenous  PONV Risk Score and Plan: 4 or greater and Treatment may vary due to age or medical condition, Ondansetron, Dexamethasone, Midazolam and Scopolamine patch - Pre-op  Airway Management Planned: Oral ETT  Additional Equipment: None  Intra-op Plan:   Post-operative Plan: Extubation in OR  Informed Consent: I have reviewed the patients History and Physical, chart, labs and discussed the procedure including the risks, benefits and alternatives for the proposed anesthesia with the patient or authorized representative who has indicated his/her understanding and acceptance.     Dental advisory given  Plan Discussed with: CRNA and Anesthesiologist  Anesthesia Plan Comments:        Anesthesia Quick Evaluation

## 2021-10-27 ENCOUNTER — Encounter (HOSPITAL_BASED_OUTPATIENT_CLINIC_OR_DEPARTMENT_OTHER): Payer: Self-pay | Admitting: Obstetrics and Gynecology

## 2021-10-27 LAB — SURGICAL PATHOLOGY

## 2021-10-30 LAB — CYTOLOGY - NON PAP

## 2022-01-27 DIAGNOSIS — Z789 Other specified health status: Secondary | ICD-10-CM | POA: Insufficient documentation

## 2022-01-27 DIAGNOSIS — M2550 Pain in unspecified joint: Secondary | ICD-10-CM | POA: Insufficient documentation

## 2022-01-27 DIAGNOSIS — M79671 Pain in right foot: Secondary | ICD-10-CM | POA: Insufficient documentation

## 2022-01-27 DIAGNOSIS — G8929 Other chronic pain: Secondary | ICD-10-CM | POA: Insufficient documentation

## 2022-04-30 DIAGNOSIS — D500 Iron deficiency anemia secondary to blood loss (chronic): Secondary | ICD-10-CM | POA: Insufficient documentation

## 2022-04-30 DIAGNOSIS — D219 Benign neoplasm of connective and other soft tissue, unspecified: Secondary | ICD-10-CM | POA: Insufficient documentation

## 2022-04-30 DIAGNOSIS — N926 Irregular menstruation, unspecified: Secondary | ICD-10-CM | POA: Insufficient documentation

## 2022-08-19 ENCOUNTER — Ambulatory Visit (INDEPENDENT_AMBULATORY_CARE_PROVIDER_SITE_OTHER): Payer: Medicaid Other

## 2022-08-19 ENCOUNTER — Encounter (HOSPITAL_COMMUNITY): Payer: Self-pay | Admitting: Emergency Medicine

## 2022-08-19 ENCOUNTER — Ambulatory Visit (HOSPITAL_COMMUNITY)
Admission: EM | Admit: 2022-08-19 | Discharge: 2022-08-19 | Disposition: A | Discharge status: Home / Self Care | Payer: Medicaid Other | Attending: Physician Assistant | Admitting: Physician Assistant

## 2022-08-19 DIAGNOSIS — M25521 Pain in right elbow: Secondary | ICD-10-CM

## 2022-08-19 DIAGNOSIS — M79601 Pain in right arm: Secondary | ICD-10-CM

## 2022-08-19 DIAGNOSIS — M25511 Pain in right shoulder: Secondary | ICD-10-CM | POA: Diagnosis not present

## 2022-08-19 DIAGNOSIS — Z23 Encounter for immunization: Secondary | ICD-10-CM

## 2022-08-19 DIAGNOSIS — S50311A Abrasion of right elbow, initial encounter: Secondary | ICD-10-CM

## 2022-08-19 DIAGNOSIS — W19XXXA Unspecified fall, initial encounter: Secondary | ICD-10-CM

## 2022-08-19 MED ORDER — TIZANIDINE HCL 4 MG PO TABS
4.0000 mg | ORAL_TABLET | Freq: Three times a day (TID) | ORAL | 0 refills | Status: DC | PRN
Start: 2022-08-19 — End: 2023-10-04

## 2022-08-19 MED ORDER — TETANUS-DIPHTH-ACELL PERTUSSIS 5-2.5-18.5 LF-MCG/0.5 IM SUSY
0.5000 mL | PREFILLED_SYRINGE | Freq: Once | INTRAMUSCULAR | Status: AC
  Administered 2022-08-19: 0.5 mL via INTRAMUSCULAR

## 2022-08-19 MED ORDER — TETANUS-DIPHTH-ACELL PERTUSSIS 5-2.5-18.5 LF-MCG/0.5 IM SUSY
PREFILLED_SYRINGE | INTRAMUSCULAR | Status: AC
  Filled 2022-08-19: qty 0.5

## 2022-08-19 MED ORDER — MUPIROCIN 2 % EX OINT: 1.0000 | TOPICAL_OINTMENT | Freq: Two times a day (BID) | CUTANEOUS | 0 refills | Status: AC

## 2022-08-19 MED ORDER — NAPROXEN 375 MG PO TABS: 375.0000 mg | ORAL_TABLET | Freq: Two times a day (BID) | ORAL | 0 refills | Status: DC

## 2022-08-19 NOTE — ED Triage Notes (Signed)
Used interpretor: Pt having right side pains from fall on Monday when leaving work when stepped on rock and twisted foot. Right arm is severe pain. Pt reports right leg just has bruise like from falling down.  Pt reports that has hard time holding cup water, phone for periods of time with right arm due to pains.  Ibuprofen and tylenol on Monday after fall and slept long time. Tuesday when woke up when pains were worse.  Didn't work yesterday or today.

## 2022-08-19 NOTE — ED Provider Notes (Signed)
[facility]    CSN: [id number] Arrival date & time: 08/19/22  1056      History   Chief Complaint Chief Complaint  Patient presents with   Fall    HPI Darlene Cruz is a 35 y.o. female.   Patient presents today for evaluation after falling approximately 2 days ago.  She is Arabic speaking and video interpreter was utilized during visit.  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  She has had ongoing pain in her right arm since that time.  She initially had some pain in her right leg but this has resolved with over-the-counter analgesic use.  She reports some numbness and paresthesias in the arm.  She is right-handed and having difficulty with daily activities including holding her phone.  She denies previous injury or surgery involving her neck or shoulder.  She denies history of osteoporosis but does have a history of vitamin D deficiency.  She is confident that she is not pregnant.  She did not hit her head during the fall and denies any loss of consciousness, dizziness, headache, nausea, vomiting.  She did sustain a small abrasion on her right elbow.  She is unsure when her last tetanus was but believes this was several years ago.    Past Medical History:  Diagnosis Date   Pelvic pain in female 03/12/2016    Patient Active Problem List   Diagnosis Date Noted   Morbid obesity [facility])    History of vitamin D deficiency 05/15/2019    Past Surgical History:  Procedure Laterality Date   CESAREAN SECTION  2011   approx   DILATION AND CURETTAGE OF UTERUS  2009   missed ab   KNEE SURGERY Left 2010   LAPAROSCOPIC OVARIAN CYSTECTOMY Bilateral 10/24/2021   Procedure: DIAGNOSTIC LAPROSCOPY/ PERITONEAL BIOPSIES;  Surgeon: Osborn Coho, MD;  Location: [facility];  Service: Gynecology;  Laterality: Bilateral;   TONSILLECTOMY AND ADENOIDECTOMY     age 73    OB History      Gravida  6   Para  1   Term  1   Preterm  0   AB  5   Living  1      SAB  5   IAB  0   Ectopic  0   Multiple  0   Live Births  1            Home Medications    Prior to Admission medications   Medication Sig Start Date End Date Taking? Authorizing Provider  mupirocin ointment (BACTROBAN) 2 % Apply 1 Application topically 2 (two) times daily. 08/19/22  Yes Jasslyn Finkel K, PA-C  naproxen (NAPROSYN) 375 MG tablet Take 1 tablet (375 mg total) by mouth 2 (two) times daily. 08/19/22  Yes Shyheim Tanney K, PA-C  tiZANidine (ZANAFLEX) 4 MG tablet Take 1 tablet (4 mg total) by mouth every 8 (eight) hours as needed for muscle spasms. 08/19/22  Yes Yovanni Frenette, Noberto Retort, PA-C  Cholecalciferol (VITAMIN D PO) Take 1 tablet by mouth once a week. Every 5 days    [provider]  ibuprofen (ADVIL) 600 MG tablet Take 1 tablet (600 mg total) by mouth every 6 (six) hours as needed. 10/24/21   Osborn Coho, MD  mirtazapine (REMERON) 15 MG tablet Take 15 mg by mouth at bedtime. 11/04/20   [provider]  norethindrone (AYGESTIN)  5 MG tablet Take 10 mg by mouth daily.    [provider]    Family History Family History  Problem Relation Age of Onset   Hypertension Mother    Diabetes Mother    Hyperlipidemia Mother     Social History Social History   Tobacco Use   Smoking status: Former    Packs/day: 0.20    Years: 5.00    Total pack years: 1.00    Types: Cigarettes   Smokeless tobacco: Never   Tobacco comments:    quit July 2016   Substance Use Topics   Alcohol use: No    Alcohol/week: 0.0 standard drinks of alcohol   Drug use: No     Allergies   Patient has no known allergies.   Review of Systems Review of Systems  Constitutional:  Positive for activity change. Negative for appetite change, fatigue and fever.  Eyes:  Negative for visual disturbance.  Musculoskeletal:  Positive for arthralgias and myalgias.  Skin:  Positive for wound.   Neurological:  Positive for numbness. Negative for dizziness, weakness, light-headedness and headaches.     Physical Exam Triage Vital Signs ED Triage Vitals  Enc Vitals Group     BP 08/19/22 1252 (!) 108/59     Pulse Rate 08/19/22 1252 70     Resp 08/19/22 1252 19     Temp 08/19/22 1252 (!) 97.5 F (36.4 C)     Temp Source 08/19/22 1252 Oral     SpO2 08/19/22 1252 99 %     Weight --      Height --      Head Circumference --      Peak Flow --      Pain Score 08/19/22 1245 9     Pain Loc --      Pain Edu? --      Excl. in [place]? --    No data found.  Updated Vital Signs BP (!) 108/59 (BP Location: Left Arm)   Pulse 70   Temp (!) 97.5 F (36.4 C) (Oral)   Resp 19   LMP 07/28/2022   SpO2 99%   Visual Acuity Right Eye Distance:   Left Eye Distance:   Bilateral Distance:    Right Eye Near:   Left Eye Near:    Bilateral Near:     Physical Exam Vitals reviewed.  Constitutional:      General: She is awake. She is not in acute distress.    Appearance: Normal appearance. She is well-developed. She is not ill-appearing.     Comments: Very pleasant female appears stated age in no acute distress sitting comfortably in exam room  HENT:     Head: Normocephalic and atraumatic.  Cardiovascular:     Rate and Rhythm: Normal rate and regular rhythm.     Heart sounds: Normal heart sounds, S1 normal and S2 normal. No murmur heard.    Comments: Capillary refill within 2 seconds right fingers Pulmonary:     Effort: Pulmonary effort is normal.     Breath sounds: Normal breath sounds. No wheezing, rhonchi or rales.     Comments: Clear to auscultation bilaterally Musculoskeletal:     Right shoulder: Tenderness present. No swelling or bony tenderness. Decreased range of motion. Decreased strength.     Right upper arm: Tenderness present. No bony tenderness.     Right elbow: Laceration (Abrasion) present. Decreased range of motion. Tenderness present.     Right forearm: Tenderness  present. No swelling or  bony tenderness.     Right wrist: No bony tenderness or snuff box tenderness. Normal range of motion.     Right hand: No bony tenderness. Normal strength. There is no disruption of two-point discrimination. Normal capillary refill.     Comments: Right arm: Tenderness palpation over AC joint.  Decreased range of motion with extension, internal rotation, external rotation of shoulder.  Negative empty can and drop arm.  Tender to palpation over humerus without deformity.  Strength 5/5 bilateral upper extremity.  Abrasion noted over lateral elbow without active bleeding or drainage.  Tenderness palpation over lateral condyle without deformity.  Normal active range of motion of elbow.  Hand is neurovascularly intact.  Psychiatric:        Behavior: Behavior is cooperative.      UC Treatments / Results  Labs (all labs ordered are listed, but only abnormal results are displayed) Labs Reviewed - No data to display  EKG   Radiology DG Elbow Complete Right  Result Date: 08/19/2022 CLINICAL DATA:  Trauma, fall, pain EXAM: RIGHT ELBOW - COMPLETE 3+ VIEW COMPARISON:  None Available. FINDINGS: There is no evidence of fracture, dislocation, or joint effusion. There is no evidence of arthropathy or other focal bone abnormality. Soft tissues are unremarkable. IMPRESSION: No fracture or dislocation is seen in right elbow. Electronically Signed   By: Elmer Picker M.D.   On: 08/19/2022 13:48   DG Shoulder Right  Result Date: 08/19/2022 CLINICAL DATA:  Pain, recent fall EXAM: RIGHT SHOULDER - 2+ VIEW COMPARISON:  None Available. FINDINGS: There is no evidence of fracture or dislocation. There is no evidence of arthropathy or other focal bone abnormality. Soft tissues are unremarkable. IMPRESSION: No fracture or dislocation is seen in right shoulder. Electronically Signed   By: Elmer Picker M.D.   On: 08/19/2022 13:47    Procedures Procedures (including critical care  time)  Medications Ordered in UC Medications  Tdap (BOOSTRIX) injection 0.5 mL (0.5 mLs Intramuscular Given 08/19/22 1319)    Initial Impression / Assessment and Plan / UC Course  I have reviewed the triage vital signs and the nursing notes.  Pertinent labs & imaging results that were available during my care of the patient were reviewed by me and considered in my medical decision making (see chart for details).     Patient is confident that she did not hit her head during the fall.  X-ray of right shoulder and elbow obtained given significant pain showed no acute osseous abnormality.  Suspect contusion as etiology of symptoms.  Patient was started on tizanidine up to 3 times a day with instruction not to drive or drink alcohol with taking this medication.  Encouraged to use heat, rest, stretch for additional symptom relief.  She can take Naprosyn twice a day.  Discussed that she should not take additional NSAIDs with this medication including aspirin, ibuprofen/Advil, naproxen/Aleve risk of GI bleeding.  She can use acetaminophen/Tylenol for additional symptom relief.  Given abrasion tetanus was updated today.  She was encouraged to keep this area clean and apply Bactroban ointment.  She is to avoid strenuous activity including heavy lifting.  She was provided a work excuse note until 08/22/2022.  Discussed that if her symptoms or not improving she should follow-up with an orthopedic provider and was given contact information with instruction call to schedule appointment.  If she has any worsening symptoms including increased pain, numbness or tingling, weakness she needs to go to the emergency room immediately.  Strict return  precautions given.  Final Clinical Impressions(s) / UC Diagnoses   Final diagnoses:  Right arm pain  Abrasion of right elbow, initial encounter  Fall, initial encounter     Discharge Instructions      You did not break any bones.  Please take tizanidine up to 3 times  a day.  This will make you sleepy so do not drive or drink alcohol with taking it.  Take Naprosyn twice a day.  Do not take NSAIDs with this medication including aspirin, ibuprofen/Advil, naproxen/Aleve.  Keep the wound on your elbow clean.  Apply Bactroban ointment twice daily.  I do want you to follow-up with an orthopedic provider.  Please call them to schedule an appointment as soon as possible.  If you have any worsening or changing symptoms you need to be seen immediately.     ED Prescriptions     Medication Sig Dispense Auth. Provider   tiZANidine (ZANAFLEX) 4 MG tablet Take 1 tablet (4 mg total) by mouth every 8 (eight) hours as needed for muscle spasms. 21 tablet Elston Aldape K, PA-C   naproxen (NAPROSYN) 375 MG tablet Take 1 tablet (375 mg total) by mouth 2 (two) times daily. 20 tablet Kijana Estock K, PA-C   mupirocin ointment (BACTROBAN) 2 % Apply 1 Application topically 2 (two) times daily. 22 g Nakiea Metzner K, PA-C      PDMP not reviewed this encounter.   Terrilee Croak, PA-C 08/19/22 1411

## 2022-08-19 NOTE — Discharge Instructions (Signed)
You did not break any bones.  Please take tizanidine up to 3 times a day.  This will make you sleepy so do not drive or drink alcohol with taking it.  Take Naprosyn twice a day.  Do not take NSAIDs with this medication including aspirin, ibuprofen/Advil, naproxen/Aleve.  Keep the wound on your elbow clean.  Apply Bactroban ointment twice daily.  I do want you to follow-up with an orthopedic provider.  Please call them to schedule an appointment as soon as possible.  If you have any worsening or changing symptoms you need to be seen immediately.

## 2022-12-14 ENCOUNTER — Encounter (HOSPITAL_COMMUNITY): Payer: Self-pay

## 2022-12-14 ENCOUNTER — Emergency Department (HOSPITAL_COMMUNITY)
Admission: EM | Admit: 2022-12-14 | Discharge: 2022-12-14 | Disposition: A | Discharge status: Home / Self Care | Payer: Medicaid Other | Attending: Emergency Medicine | Admitting: Emergency Medicine

## 2022-12-14 DIAGNOSIS — R21 Rash and other nonspecific skin eruption: Secondary | ICD-10-CM | POA: Insufficient documentation

## 2022-12-14 DIAGNOSIS — T7840XA Allergy, unspecified, initial encounter: Secondary | ICD-10-CM

## 2022-12-14 MED ORDER — FAMOTIDINE 20 MG PO TABS
20.0000 mg | ORAL_TABLET | Freq: Once | ORAL | Status: AC
  Administered 2022-12-14: 20 mg via ORAL
  Filled 2022-12-14: qty 1

## 2022-12-14 MED ORDER — PREDNISONE 20 MG PO TABS
60.0000 mg | ORAL_TABLET | Freq: Once | ORAL | Status: AC
  Administered 2022-12-14: 60 mg via ORAL
  Filled 2022-12-14: qty 3

## 2022-12-14 MED ORDER — PREDNISONE 20 MG PO TABS: ORAL_TABLET | ORAL | 0 refills | Status: DC

## 2022-12-14 MED ORDER — FAMOTIDINE 20 MG PO TABS: 20.0000 mg | ORAL_TABLET | Freq: Every day | ORAL | 0 refills | Status: DC

## 2022-12-14 MED ORDER — DIPHENHYDRAMINE HCL 25 MG PO CAPS
25.0000 mg | ORAL_CAPSULE | Freq: Once | ORAL | Status: AC
  Administered 2022-12-14: 25 mg via ORAL
  Filled 2022-12-14: qty 1

## 2022-12-14 MED ORDER — DIPHENHYDRAMINE HCL 25 MG PO TABS: 25.0000 mg | ORAL_TABLET | Freq: Four times a day (QID) | ORAL | 0 refills | Status: AC

## 2022-12-14 NOTE — ED Provider Notes (Signed)
[place] EMERGENCY DEPARTMENT AT [facility] Provider Note   CSN: [id number] Arrival date & time: 12/14/22  0241     History  Chief Complaint  Patient presents with   Allergic Reaction    Darlene Cruz is a 37 y.o. female.   Allergic Reaction Presenting symptoms: rash    37 y.o. F presenting to the ED with allergic reaction.  History provided by patient and son at bedside who is assisting with translation at times.  For the past several months she has been having intermittent outbreaks of hives and itching but is not sure what it is from.  She denies associated SOB, difficulty swallowing, sensation of throat closing.  No nausea/vomiting.  No meds taken PTA as she was not sure what to take.  No changes in soaps, detergents, personal care products, etc.  No known allergens.  States she is desiring some allergy testing to determine what is causing this.  Home Medications Prior to Admission medications   Medication Sig Start Date End Date Taking? Authorizing Provider  diphenhydrAMINE (BENADRYL) 25 MG tablet Take 1 tablet (25 mg total) by mouth every 6 (six) hours. 12/14/22  Yes Larene Pickett, PA-C  famotidine (PEPCID) 20 MG tablet Take 1 tablet (20 mg total) by mouth daily. OTC 12/14/22  Yes Larene Pickett, PA-C  predniSONE (DELTASONE) 20 MG tablet Take 40 mg by mouth daily for 3 days, then 20mg  by mouth daily for 3 days, then 10mg  daily for 3 days 12/14/22  Yes Larene Pickett, PA-C  Cholecalciferol (VITAMIN D PO) Take 1 tablet by mouth once a week. Every 5 days    [provider]  ibuprofen (ADVIL) 600 MG tablet Take 1 tablet (600 mg total) by mouth every 6 (six) hours as needed. 10/24/21   Everett Graff, MD  mirtazapine (REMERON) 15 MG tablet Take 15 mg by mouth at bedtime. 11/04/20   [provider]  mupirocin ointment (BACTROBAN) 2 % Apply 1 Application topically 2 (two) times daily. 08/19/22   Raspet, Derry Skill, PA-C  naproxen (NAPROSYN) 375 MG  tablet Take 1 tablet (375 mg total) by mouth 2 (two) times daily. 08/19/22   Raspet, Derry Skill, PA-C  norethindrone (AYGESTIN) 5 MG tablet Take 10 mg by mouth daily.    [provider]  tiZANidine (ZANAFLEX) 4 MG tablet Take 1 tablet (4 mg total) by mouth every 8 (eight) hours as needed for muscle spasms. 08/19/22   Raspet, Derry Skill, PA-C      Allergies    Patient has no known allergies.    Review of Systems   Review of Systems  Skin:  Positive for rash.  All other systems reviewed and are negative.   Physical Exam Updated Vital Signs BP (!) 158/106 (BP Location: Left Arm)   Pulse 77   Temp 98.2 F (36.8 C) (Oral)   Resp 16   Ht 5\' 2"  (1.575 m)   Wt 127 kg   SpO2 99%   BMI 51.21 kg/m   Physical Exam Vitals and nursing note reviewed.  Constitutional:      Appearance: She is well-developed.  HENT:     Head: Normocephalic and atraumatic.     Mouth/Throat:     Comments: No lip/tongue swelling, handling secretions well, no stridor Eyes:     Conjunctiva/sclera: Conjunctivae normal.     Pupils: Pupils are equal, round, and reactive to light.  Cardiovascular:     Rate and Rhythm: Normal rate and regular  rhythm.     Heart sounds: Normal heart sounds.  Pulmonary:     Effort: Pulmonary effort is normal.     Breath sounds: Normal breath sounds. No wheezing or rhonchi.  Abdominal:     General: Bowel sounds are normal.     Palpations: Abdomen is soft.  Musculoskeletal:        General: Normal range of motion.     Cervical back: Normal range of motion.  Skin:    General: Skin is warm and dry.     Comments: Urticarial rash to back/flank, some excoriation present from scratching but no superimposed infection/cellulitis  Neurological:     Mental Status: She is alert and oriented to person, place, and time.     ED Results / Procedures / Treatments   Labs (all labs ordered are listed, but only abnormal results are displayed) Labs Reviewed - No data to  display  EKG None  Radiology No results found.  Procedures Procedures    Medications Ordered in ED Medications  predniSONE (DELTASONE) tablet 60 mg (60 mg Oral Given 12/14/22 0307)  diphenhydrAMINE (BENADRYL) capsule 25 mg (25 mg Oral Given 12/14/22 0307)  famotidine (PEPCID) tablet 20 mg (20 mg Oral Given 12/14/22 0307)    ED Course/ Medical Decision Making/ A&P                             Medical Decision Making Risk OTC drugs. Prescription drug management.   37 y.o. F here with allergic reaction/rash.  Intermittent over the past few months without known cause.  No lip/tongue swelling, handling secretions well, no stridor.  Lungs CTAB.  No GI symptoms.  Not clinically consistent with true anaphylaxis.  Treated here with oral meds.  She is requesting allergy testing-- referred to asthma and allergy center.  Will continue symptomatic care at home-- prednisone taper, benadryl, pepcid.  Can return here for new concerns.  Final Clinical Impression(s) / ED Diagnoses Final diagnoses:  Allergic reaction, initial encounter    Rx / DC Orders ED Discharge Orders          Ordered    predniSONE (DELTASONE) 20 MG tablet        12/14/22 0304    diphenhydrAMINE (BENADRYL) 25 MG tablet  Every 6 hours        12/14/22 0304    famotidine (PEPCID) 20 MG tablet  Daily        12/14/22 0304              Larene Pickett, PA-C 12/14/22 0313    Molpus, Jenny Reichmann, MD 12/14/22 [id number]

## 2022-12-14 NOTE — ED Triage Notes (Signed)
Pt here with son arrived POV for possible allergic reaction to unknown source. Reports rash to back and arms that causes itching. Would like to do testing to find out the source. Denies no new soaps, detergents, foods, or lotions. Has not tired any OTC medications. Denies SOB or difficulty swallowing.

## 2022-12-14 NOTE — Discharge Instructions (Addendum)
Take the prescribed medication as directed. Follow-up with the allergy center, they can arrange some allergy testing for you.  Call in the morning to get this scheduled. Return to the ED for new or worsening symptoms.

## 2022-12-17 ENCOUNTER — Encounter: Payer: Self-pay | Admitting: Allergy & Immunology

## 2022-12-17 ENCOUNTER — Ambulatory Visit (INDEPENDENT_AMBULATORY_CARE_PROVIDER_SITE_OTHER): Payer: Medicaid Other | Admitting: Allergy & Immunology

## 2022-12-17 ENCOUNTER — Other Ambulatory Visit: Payer: Self-pay

## 2022-12-17 VITALS — BP 138/72 | HR 89 | Temp 98.0°F | Resp 20 | Ht 63.0 in | Wt 295.0 lb

## 2022-12-17 DIAGNOSIS — L508 Other urticaria: Secondary | ICD-10-CM | POA: Diagnosis not present

## 2022-12-17 DIAGNOSIS — Z889 Allergy status to unspecified drugs, medicaments and biological substances status: Secondary | ICD-10-CM | POA: Diagnosis not present

## 2022-12-17 DIAGNOSIS — T50905D Adverse effect of unspecified drugs, medicaments and biological substances, subsequent encounter: Secondary | ICD-10-CM

## 2022-12-17 MED ORDER — FAMOTIDINE 20 MG PO TABS: 20.0000 mg | ORAL_TABLET | Freq: Two times a day (BID) | ORAL | 5 refills | Status: DC

## 2022-12-17 MED ORDER — LEVOCETIRIZINE DIHYDROCHLORIDE 5 MG PO TABS: 10.0000 mg | ORAL_TABLET | Freq: Every morning | ORAL | 5 refills | Status: DC

## 2022-12-17 MED ORDER — CETIRIZINE HCL 10 MG PO TABS: 20.0000 mg | ORAL_TABLET | Freq: Every day | ORAL | 5 refills | Status: DC

## 2022-12-17 NOTE — Patient Instructions (Addendum)
1. Chronic urticaria - Your history does not have any "red flags" such as fevers, joint pains, or permanent skin changes that would be concerning for a more serious cause of hives.  - We will get some labs to rule out serious causes of hives: alpha gal panel, complete blood count, tryptase level, chronic urticaria panel, CMP, ESR, and CRP. - We will also get an environmental allergy panel to see if you have evidence of allergies.  - Hopefully these labs will tell us more about whta is cuasing your symptoms.  - Chronic hives are often times a self limited process and will "burn themselves out" over 6-12 months, although this is not always the case.  - In the meantime, we will continue with suppressive dosing of antihistamines:   - Morning: Xyzal (levocetirizine) 10mg  (two tablets) + Pepcid (famotidine) 20mg   - Evening: Zyrtec (cetirizine) 20mg  (two tablets) + Pepcid (famotidine) 20mg  - You can change this dosing at home, decreasing the dose as needed or increasing the dosing as needed.  - If you are not tolerating the medications or are tired of taking them every day, we can start treatment with a monthly injectable medication called Xolair.   2. Concern for antibiotic allergies - We need to rule out these antibiotic allergies so that you have more options in case you need antibiotics in the future. - Make an appointment for a NITROFURANTOIN challenge visit in the office. - Make an appointment for a BACTRIM challenge visit in the office.  3. Return in about 4 weeks (around 01/14/2023). You can have the follow up appointment with Dr. Ernst Bowler or a Nurse Practicioner (our Nurse Practitioners are excellent and always have Physician oversight!).    Please inform us of any Emergency Department visits, hospitalizations, or changes in symptoms. Call us before going to the ED for breathing or allergy symptoms since we might be able to fit you in for a sick visit. Feel free to contact us anytime with any  questions, problems, or concerns.  It was a pleasure to meet you today!  Websites that have reliable patient information: 1. American Academy of Asthma, Allergy, and Immunology: www.aaaai.org 2. Food Allergy Research and Education (FARE): foodallergy.org 3. Mothers of Asthmatics: http://www.asthmacommunitynetwork.org 4. American College of Allergy, Asthma, and Immunology: www.acaai.org   COVID-19 Vaccine Information can be found at: ShippingScam.co.uk For questions related to vaccine distribution or appointments, please email vaccine@[email or web address] [email or web address] or call [phone].   We realize that you might be concerned about having an allergic reaction to the COVID19 vaccines. To help with that concern, WE ARE OFFERING THE COVID19 VACCINES IN OUR OFFICE! Ask the front desk for dates!     "Like" [place] on Facebook and Instagram for our latest updates!      A healthy democracy works best when [employer] participate! Make sure you are registered to vote! If you have moved or changed any of your contact information, you will need to get this updated before voting!  In some cases, you MAY be able to register to vote online: [email or web address]

## 2022-12-17 NOTE — Progress Notes (Signed)
NEW PATIENT  Date of Service/Encounter:  12/17/22  Consult requested by: Patient, No Pcp Per   Assessment:   Chronic urticaria - getting labs and treating with suppressive doses of antihistamines in the meantime  Adverse drug effect - planning for nitrofurantoin and Bactrim challenges to take this out of the equation  Plan/Recommendations:   1. Chronic urticaria - Your history does not have any "red flags" such as fevers, joint pains, or permanent skin changes that would be concerning for a more serious cause of hives.  - We will get some labs to rule out serious causes of hives: alpha gal panel, complete blood count, tryptase level, chronic urticaria panel, CMP, ESR, and CRP. - We will also get an environmental allergy panel to see if you have evidence of allergies.  - Hopefully these labs will tell us more about whta is cuasing your symptoms.  - Chronic hives are often times a self limited process and will "burn themselves out" over 6-12 months, although this is not always the case.  - In the meantime, we will continue with suppressive dosing of antihistamines:   - Morning: Xyzal (levocetirizine) 10mg  (two tablets) + Pepcid (famotidine) 20mg   - Evening: Zyrtec (cetirizine) 20mg  (two tablets) + Pepcid (famotidine) 20mg  - You can change this dosing at home, decreasing the dose as needed or increasing the dosing as needed.  - If you are not tolerating the medications or are tired of taking them every day, we can start treatment with a monthly injectable medication called Xolair.   2. Concern for antibiotic allergies - We need to rule out these antibiotic allergies so that you have more options in case you need antibiotics in the future. - Make an appointment for a NITROFURANTOIN challenge visit in the office. - Make an appointment for a BACTRIM challenge visit in the office.  3. Return in about 4 weeks (around 01/14/2023). You can have the follow up appointment with Dr. Ernst Bowler or  a Nurse Practicioner (our Nurse Practitioners are excellent and always have Physician oversight!).     This note in its entirety was forwarded to the Provider who requested this consultation.  Subjective:   Darlene Cruz is a 37 y.o. female presenting today for evaluation of  Chief Complaint  Patient presents with   Allergic Reaction    Saturday night pt states she had rash come up and itchy all over and sudden just happen out of know where not sure what it's coming from.   Other    Darlene Cruz has a history of the following: Patient Active Problem List   Diagnosis Date Noted   Morbid obesity    History of vitamin D deficiency 05/15/2019    History obtained from: chart review and patient via an interpreter. 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   Darlene Cruz was referred by Patient, No Pcp Per.     Darlene Cruz is a 37 y.o. female presenting for an evaluation of pruritus and breakouts .  Review of the notes show that she was in the emergency room on April 1.  She has been having breakouts for a few months now.  She reports hives and itching.  She had no shortness of breath, difficulty swallowing, or throat closure.  She had no nausea or vomiting.  There are no changes in her soaps, detergents, or cosmetics.  She has no known allergens.  In the emergency room, she was treated  with prednisone, Benadryl, and Pepcid.  She was then referred here for further evaluation.  She tells me today that this started on Saturday night. It was a sudden onset with itching. There is no rash. When she scratches, she has urticaria that pops up. This happened 18 months ago June 2022, but this was from a UTI treatment. Then three days into the treatment, she had the itching and hives. This year was the similar reaction. She was given nitrofurantoin at the end of March.    I reviewed the notes from June 2022. It looks like she might have received Bactrim  for that. She was also on pyridium at the time. She remembers this date because she passed her citizenship test at that time. It was very scary at that time with wheals and swelling.   This most recent episode was worse with swelling and pain in her joints. She did feel warm but there was no confirmed fever. She tells me that she will treat with ice to calm down everything down. Her skin was very red. Prednisone and the antihistamine cocktail did calm things down.    She shows me pictures with some classic appearing urticaria.  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  There are no residual markings, but she does have some excoriations present as well as some bruising from the intense scratching.  There is no joint swelling that I noticed.   She has not really had much in the way of asthma or allergic rhinitis symptoms.  She does not have any food allergies that she knows of.  She has not really changed her diet based on his urticarial outbreaks.  Otherwise, there is no history of other atopic diseases, including drug allergies, stinging insect allergies, or contact dermatitis. There is no significant infectious history. Vaccinations are up to date.    Past Medical History: Patient Active Problem List   Diagnosis Date Noted   Morbid obesity    History of vitamin D deficiency 05/15/2019    Medication List:  Allergies as of 12/17/2022   No Known Allergies      Medication List        Accurate as of December 17, 2022 11:58 AM. If you have any questions, ask your nurse or doctor.          cetirizine 10 MG tablet Commonly known as: ZyrTEC Allergy Take 2 tablets (20 mg total) by mouth at bedtime. Started by: Valentina Shaggy, MD   diphenhydrAMINE 25 MG tablet Commonly known as: BENADRYL Take 1 tablet (25 mg total) by mouth every 6 (six) hours.   famotidine 20 MG tablet Commonly known as: PEPCID Take 1 tablet (20 mg total) by mouth 2 (two) times daily. What changed:  when  to take this additional instructions Changed by: Valentina Shaggy, MD   ibuprofen 600 MG tablet Commonly known as: ADVIL Take 1 tablet (600 mg total) by mouth every 6 (six) hours as needed.   levocetirizine 5 MG tablet Commonly known as: XYZAL Take 2 tablets (10 mg total) by mouth in the morning. Started by: Valentina Shaggy, MD   mirtazapine 15 MG tablet Commonly known as: REMERON Take 15 mg by mouth at bedtime.   mupirocin ointment 2 % Commonly known as: BACTROBAN Apply 1 Application topically 2 (two) times daily.   naproxen 375 MG tablet Commonly known as: NAPROSYN Take 1 tablet (375 mg total) by mouth 2 (two) times daily.   norethindrone 5 MG tablet Commonly  known as: AYGESTIN Take 10 mg by mouth daily.   predniSONE 20 MG tablet Commonly known as: DELTASONE Take 40 mg by mouth daily for 3 days, then 20mg  by mouth daily for 3 days, then 10mg  daily for 3 days   tiZANidine 4 MG tablet Commonly known as: ZANAFLEX Take 1 tablet (4 mg total) by mouth every 8 (eight) hours as needed for muscle spasms.   VITAMIN D PO Take 1 tablet by mouth once a week. Every 5 days        Birth History: non-contributory  Developmental History: non-contributory  Past Surgical History: Past Surgical History:  Procedure Laterality Date   CESAREAN SECTION  2011   approx   DILATION AND CURETTAGE OF UTERUS  2009   missed ab   KNEE SURGERY Left 2010   LAPAROSCOPIC OVARIAN CYSTECTOMY Bilateral 10/24/2021   Procedure: DIAGNOSTIC LAPROSCOPY/ PERITONEAL BIOPSIES;  Surgeon: Everett Graff, MD;  Location: [facility];  Service: Gynecology;  Laterality: Bilateral;   TONSILLECTOMY AND ADENOIDECTOMY     age 74     Family History: Family History  Problem Relation Age of Onset   Hypertension Mother    Diabetes Mother    Hyperlipidemia Mother      Social History: Darlene Cruz lives at home with her family.  They live in an apartment that is 37 years old.  There is  carpeting throughout the apartment.  They have electric heating.  There are no animals inside or outside of the home.  There are no dust mite covers on the bedding.  There is no tobacco exposure.  There is fume, chemical, and dust exposure.  There is a HEPA filter in the home.  They do not live near an interstate or industrial area.   Review of Systems  Constitutional: Negative.  Negative for chills, fever, malaise/fatigue and weight loss.  HENT: Negative.  Negative for congestion, ear discharge and ear pain.   Eyes:  Negative for pain, discharge and redness.  Respiratory:  Negative for cough, sputum production, shortness of breath and wheezing.   Cardiovascular: Negative.  Negative for chest pain and palpitations.  Gastrointestinal:  Negative for abdominal pain, heartburn, nausea and vomiting.  Musculoskeletal:  Positive for joint pain.  Skin:  Positive for itching and rash.  Neurological:  Negative for dizziness and headaches.  Endo/Heme/Allergies:  Negative for environmental allergies. Does not bruise/bleed easily.       Objective:   Blood pressure 138/72, pulse 89, temperature 98 F (36.7 C), resp. rate 20, height 5\' 3"  (1.6 m), weight 295 lb (133.8 kg), SpO2 97 %. Body mass index is 52.26 kg/m.     Physical Exam Vitals reviewed.  Constitutional:      Appearance: Normal appearance. She is well-developed. She is not ill-appearing or toxic-appearing.  HENT:     Head: Normocephalic and atraumatic.     Right Ear: Tympanic membrane, ear canal and external ear normal. No drainage, swelling or tenderness. Tympanic membrane is not injected, scarred, erythematous, retracted or bulging.     Left Ear: Tympanic membrane, ear canal and external ear normal. No drainage, swelling or tenderness. Tympanic membrane is not injected, scarred, erythematous, retracted or bulging.     Nose: No nasal deformity, septal deviation, mucosal edema or rhinorrhea.     Right Turbinates: Enlarged and  swollen.     Left Turbinates: Enlarged and swollen.     Right Sinus: No maxillary sinus tenderness or frontal sinus tenderness.     Left Sinus: No maxillary  sinus tenderness or frontal sinus tenderness.     Mouth/Throat:     Mouth: Mucous membranes are not pale and not dry.     Pharynx: Uvula midline.  Eyes:     General:        Right eye: No discharge.        Left eye: No discharge.     Conjunctiva/sclera: Conjunctivae normal.     Right eye: Right conjunctiva is not injected. No chemosis.    Left eye: Left conjunctiva is not injected. No chemosis.    Pupils: Pupils are equal, round, and reactive to light.  Cardiovascular:     Rate and Rhythm: Normal rate and regular rhythm.     Heart sounds: Normal heart sounds.  Pulmonary:     Effort: Pulmonary effort is normal. No tachypnea, accessory muscle usage or respiratory distress.     Breath sounds: Normal breath sounds. No wheezing, rhonchi or rales.  Chest:     Chest wall: No tenderness.  Abdominal:     Tenderness: There is no abdominal tenderness. There is no guarding or rebound.  Lymphadenopathy:     Head:     Right side of head: No submandibular, tonsillar or occipital adenopathy.     Left side of head: No submandibular, tonsillar or occipital adenopathy.     Cervical: No cervical adenopathy.  Skin:    Coloration: Skin is not pale.     Findings: Rash present. No abrasion, erythema or petechiae. Rash is not papular, urticarial or vesicular.     Comments: She does have some excoriations on her bilateral arms.  No urticaria appreciated today.  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  Neurological:     Mental Status: She is alert.  Psychiatric:        Behavior: Behavior is cooperative.      Diagnostic studies: labs sent instead          Salvatore Marvel, MD [facility]

## 2022-12-23 LAB — ALPHA-GAL PANEL
Allergen Lamb IgE: <0.1 kU/L
Beef IgE: <0.1 kU/L
IgE (Immunoglobulin E), Serum: 339 IU/mL (ref 6–495)
O215-IgE Alpha-Gal: <0.1 kU/L
Pork IgE: <0.1 kU/L

## 2022-12-24 ENCOUNTER — Telehealth: Payer: Self-pay

## 2022-12-24 NOTE — Telephone Encounter (Signed)
-----   Message from Ma Hillock, [place] sent at 12/17/2022  1:10 PM EDT -----  ----- Message ----- From: Alfonse Spruce, MD Sent: 12/17/2022  12:29 PM EDT To: [facility]  Hey there - she was supposed to be placed in a CHALLENGE slot. It is ok to do with an NP if needed. Can we change her to a different slot? And while you on are the phone with her, please ask her to STOP the levocetirizine for 3 days before her appt since it will be a challenge? [place] translator needed. Thanks!

## 2022-12-24 NOTE — Telephone Encounter (Signed)
R/S per Dr. Dellis Anes pt needs to schedule a drug challenge to nitrofurantoin monohydrate/macrocrystals. Patient requested this interpreter only sahar Mohamedain [phone] per the appointment notes.   I contacted the number above and was told to contact her boss to put a ticket in. I did that and the office told me to contact Shanda Bumps with [facility]. I contacted Shanda Bumps with [facility] and she told me that I have to call the language line @ [phone] as we can't request for a specific interpreter for phone calls. Shanda Bumps also stated that we need to make sure we put Interpreter/Sahar (CAP) with the amount of time needed in the beginning of the appointment notes to try and get Sahar as a interpreter for her next appointments.  I called the language line and they called the patient twice and both times it went to voicemail with out a mail box set up.

## 2022-12-24 NOTE — Telephone Encounter (Signed)
I guess we will make it work if she shows up.   Morrie Sheldon has the medication somewhere at the [facility] clinic.  Malachi Bonds, MD [facility]

## 2022-12-25 LAB — ALLERGENS W/COMP RFLX AREA 2
Alternaria Alternata IgE: <0.1 kU/L
Aspergillus Fumigatus IgE: <0.1 kU/L
Bermuda Grass IgE: <0.1 kU/L
Cedar, Mountain IgE: <0.1 kU/L
Cladosporium Herbarum IgE: <0.1 kU/L
Cockroach, German IgE: <0.1 kU/L
Common Silver Birch IgE: <0.1 kU/L
Cottonwood IgE: 0.11 kU/L — AB
D Farinae IgE: 17.1 kU/L — AB
D Pteronyssinus IgE: 5.79 kU/L — AB
E001-IgE Cat Dander: <0.1 kU/L
E005-IgE Dog Dander: 0.13 kU/L — AB
Elm, American IgE: <0.1 kU/L
IgE (Immunoglobulin E), Serum: 375 IU/mL (ref 6–495)
Johnson Grass IgE: <0.1 kU/L
Maple/Box Elder IgE: <0.1 kU/L
Mouse Urine IgE: <0.1 kU/L
Oak, White IgE: <0.1 kU/L
Pecan, Hickory IgE: <0.1 kU/L
Penicillium Chrysogen IgE: <0.1 kU/L
Pigweed, Rough IgE: <0.1 kU/L
Ragweed, Short IgE: <0.1 kU/L
Sheep Sorrel IgE Qn: <0.1 kU/L
Timothy Grass IgE: <0.1 kU/L
White Mulberry IgE: <0.1 kU/L

## 2022-12-25 LAB — CMP14+EGFR
ALT: 23 IU/L (ref 0–32)
AST: 15 IU/L (ref 0–40)
Albumin/Globulin Ratio: 1.5 (ref 1.2–2.2)
Albumin: 4.5 g/dL (ref 3.9–4.9)
Alkaline Phosphatase: 59 IU/L (ref 44–121)
BUN/Creatinine Ratio: 18 (ref 9–23)
BUN: 11 mg/dL (ref 6–20)
Bilirubin Total: <0.2 mg/dL (ref 0.0–1.2)
CO2: 20 mmol/L (ref 20–29)
Calcium: 9.6 mg/dL (ref 8.7–10.2)
Chloride: 103 mmol/L (ref 96–106)
Creatinine, Ser: 0.62 mg/dL (ref 0.57–1.00)
Globulin, Total: 3.1 g/dL (ref 1.5–4.5)
Glucose: 115 mg/dL — ABNORMAL HIGH (ref 70–99)
Potassium: 4.3 mmol/L (ref 3.5–5.2)
Sodium: 137 mmol/L (ref 134–144)
Total Protein: 7.6 g/dL (ref 6.0–8.5)
eGFR: 118 mL/min/{1.73_m2} (ref >59)

## 2022-12-25 LAB — CBC WITH DIFFERENTIAL
Basophils Absolute: 0 10*3/uL (ref 0.0–0.2)
Basos: 0 %
EOS (ABSOLUTE): 0 10*3/uL (ref 0.0–0.4)
Eos: 0 %
Hematocrit: 37.8 % (ref 34.0–46.6)
Hemoglobin: 11.9 g/dL (ref 11.1–15.9)
Immature Grans (Abs): 0.1 10*3/uL (ref 0.0–0.1)
Immature Granulocytes: 1 %
Lymphocytes Absolute: 2 10*3/uL (ref 0.7–3.1)
Lymphs: 19 %
MCH: 25.3 pg — ABNORMAL LOW (ref 26.6–33.0)
MCHC: 31.5 g/dL (ref 31.5–35.7)
MCV: 80 fL (ref 79–97)
Monocytes Absolute: 0.4 10*3/uL (ref 0.1–0.9)
Monocytes: 4 %
Neutrophils Absolute: 8.2 10*3/uL — ABNORMAL HIGH (ref 1.4–7.0)
Neutrophils: 76 %
RBC: 4.7 x10E6/uL (ref 3.77–5.28)
RDW: 15.3 % (ref 11.7–15.4)
WBC: 10.7 10*3/uL (ref 3.4–10.8)

## 2022-12-25 LAB — THYROID ANTIBODIES
Thyroglobulin Antibody: 1.1 IU/mL — ABNORMAL HIGH (ref 0.0–0.9)
Thyroperoxidase Ab SerPl-aCnc: 13 IU/mL (ref 0–34)

## 2022-12-25 LAB — SEDIMENTATION RATE: Sed Rate: 63 mm/hr — ABNORMAL HIGH (ref 0–32)

## 2022-12-25 LAB — C-REACTIVE PROTEIN: CRP: 4 mg/L (ref 0–10)

## 2022-12-25 LAB — CHRONIC URTICARIA: cu index: 4.3 (ref <10)

## 2022-12-25 LAB — TRYPTASE: Tryptase: 3.4 ug/L (ref 2.2–13.2)

## 2022-12-25 LAB — ANTINUCLEAR ANTIBODIES, IFA: ANA Titer 1: NEGATIVE

## 2022-12-25 NOTE — Telephone Encounter (Signed)
May be her spouse?  She was only there by herself and the interpreter. Sorry!

## 2022-12-28 NOTE — Telephone Encounter (Signed)
Darlene Cruz spoke with the patient to go over lab results via the interpreter line and also was able to get her rescheduled to do the challenge with Chrissie on 02/01/2023.

## 2022-12-28 NOTE — Telephone Encounter (Signed)
Thank you :)

## 2022-12-28 NOTE — Telephone Encounter (Signed)
Letter sent to the address on file.

## 2022-12-29 NOTE — Telephone Encounter (Signed)
Awesome! Again, Darlene Cruz has the medication somewhere at the [facility] office.   Malachi Bonds, MD [facility]

## 2023-01-26 ENCOUNTER — Ambulatory Visit: Payer: Medicaid Other | Admitting: Allergy & Immunology

## 2023-02-01 ENCOUNTER — Other Ambulatory Visit: Payer: Self-pay

## 2023-02-01 ENCOUNTER — Ambulatory Visit (INDEPENDENT_AMBULATORY_CARE_PROVIDER_SITE_OTHER): Payer: Medicaid Other | Admitting: Family

## 2023-02-01 ENCOUNTER — Encounter: Payer: Self-pay | Admitting: Family

## 2023-02-01 ENCOUNTER — Telehealth: Payer: Self-pay

## 2023-02-01 VITALS — BP 108/60 | HR 78 | Temp 97.9°F | Resp 16 | Wt 296.4 lb

## 2023-02-01 DIAGNOSIS — T50905D Adverse effect of unspecified drugs, medicaments and biological substances, subsequent encounter: Secondary | ICD-10-CM

## 2023-02-01 DIAGNOSIS — Z889 Allergy status to unspecified drugs, medicaments and biological substances status: Secondary | ICD-10-CM

## 2023-02-01 DIAGNOSIS — L508 Other urticaria: Secondary | ICD-10-CM | POA: Diagnosis not present

## 2023-02-01 NOTE — Telephone Encounter (Signed)
Patient was seen in office today and had questions about why her neutrophils were elevated and what treatment does she need for it. Advised patient of what these white blood cells where and what they do but patient wants to know the exact cause of the elevation.

## 2023-02-01 NOTE — Progress Notes (Signed)
[address] [place] [address] Dept: [phone]  FOLLOW UP NOTE  Patient ID: Darlene Cruz, female    DOB: 05-07-1986  Age: 37 y.o. MRN: [id number] Date of Office Visit: 02/01/2023  Assessment  Chief Complaint: Food/Drug Challenge  HPI Darlene Cruz is a 37 year old female who presents today for nitrofurantoin challenge.  She was last seen on December 17, 2022 by Dr. Dellis Anes for chronic urticaria and concern for antibiotic allergies.  An interpreter is here with her today and helps provide history.  She denies any new diagnosis or surgery since her last office visit.  Chronic urticaria: She reports for the first 3 days of taking levocetirizine she felt dizziness and pain in her chest.  Her itching during this time was good.  She then stopped the levocetirizine and the itching came back.  She then started taking the levocetirizine again after 5 PM.  She would feel okay at that time, in the morning though she would feel severely tired.  She does continue to take Pepcid 20 mg twice a day.  She does take Zyrtec 1 tablet maybe 2-3 times a week.  She has not had problems with the Zyrtec.  During this time when she was off the levocetirizine she did try taking Zyrtec 2 tablets once a day and she would still have the itching.  She also mentions that for 3 to 4 days she ran out of her levocetirizine and the itching came back bad.  She would then have the itching on the face and her body.  The face is a new area for the itching to occur.  She does have photos of erythema on her phone.  She reports that these areas will disappear within 2 minutes and move to a new area.  She has not stopped her antihistamine 3 days prior to this appointment and did not realize that she needed to.  Concern for antibiotic allergies: She reports that the nitrofurantoin helped a lot when she took it for a urinary tract infection, but year and a half ago she had a urinary tract infection and a week after  that had itching, chest pain sometimes, and difficulty swallowing.  She reports that she went to the ER and was given 6 pills and this cleared in 10 days.  She also reports that she had a reaction to another medication and is not sure if it is the same medication as the nitrofurantoin.  She reports that the doctor told her it may contain the same ingredients.  Discussed how we will going to hold off on doing the challenge today due to her newly documented symptoms of sometimes chest pain and difficulty swallowing after taking the nitrofurantoin. She reports that she gets urinary tract infections once a year.   Drug Allergies:  No Known Allergies  Review of Systems: Review of Systems  Constitutional:  Negative for chills and fever.  HENT:         Denies rhinorrhea, nasal congestion, and post nasal drip  Eyes:        Denies itchy watery eyes  Respiratory:  Negative for cough, shortness of breath and wheezing.   Cardiovascular:  Positive for chest pain. Negative for palpitations.       Reports chest pain while taking levocetirizine  Gastrointestinal:  Negative for abdominal pain, diarrhea, nausea and vomiting.  Skin:  Negative for itching and rash.  Neurological:  Negative for headaches.  Endo/Heme/Allergies:  Negative for environmental allergies.  Physical Exam: BP 108/60   Pulse 78   Temp 97.9 F (36.6 C) (Temporal)   Resp 16   Wt 296 lb 6.4 oz (134.4 kg)   SpO2 96%   BMI 52.50 kg/m    Physical Exam Constitutional:      Appearance: Normal appearance.  HENT:     Head: Normocephalic and atraumatic.     Comments: Pharynx normal. Eyes normal. Ears normal. Nose normal    Right Ear: Tympanic membrane, ear canal and external ear normal.     Left Ear: Tympanic membrane, ear canal and external ear normal.     Nose: Nose normal.     Mouth/Throat:     Mouth: Mucous membranes are moist.     Pharynx: Oropharynx is clear.  Eyes:     Conjunctiva/sclera: Conjunctivae normal.   Cardiovascular:     Rate and Rhythm: Regular rhythm.     Heart sounds: Normal heart sounds.  Pulmonary:     Effort: Pulmonary effort is normal.     Breath sounds: Normal breath sounds.     Comments: Lungs clear to auscultation Musculoskeletal:     Cervical back: Neck supple.  Skin:    General: Skin is warm.     Comments: No rashes or urticarial lesions noted on exposed skin  Neurological:     Mental Status: She is alert and oriented to person, place, and time.  Psychiatric:        Mood and Affect: Mood normal.        Behavior: Behavior normal.        Thought Content: Thought content normal.        Judgment: Judgment normal.     Diagnostics: none   Assessment and Plan: 1. Chronic urticaria   2. Adverse drug effect, subsequent encounter     No orders of the defined types were placed in this encounter.   Patient Instructions  1. Chronic urticaria - Your history does not have any "red flags" such as fevers, joint pains, or permanent skin changes that would be concerning for a more serious cause of hives.  - Reviewed lab results again  - Chronic hives are often times a self limited process and will "burn themselves out" over 6-12 months, although this is not always the case.  - Stop Xyzal (levocetirzine) due to side effects - In the meantime, we will continue with suppressive dosing of antihistamines:   - Morning: Zyrtec (cetirizine) 20mg  (two tablets) + Pepcid (famotidine) 20mg   - Evening: Zyrtec (cetirizine) 20mg  (two tablets) + Pepcid (famotidine) 20mg  - You can change this dosing at home, decreasing the dose as needed  - If you are not tolerating the medications or are tired of taking them every day, we can start treatment with a monthly injectable medication called Xolair.  -Caution as these medications can make you sleepy  2. Concern for antibiotic allergies - Continue to avoid NITROFURANTOIN AND BACTRIM for now until you see Dr. Dellis Anes at your next office  visit  3. Schedule a follow up appointment in two weeks with Dr. Dellis Anes        Return in about 2 weeks (around 02/15/2023), or if symptoms worsen or fail to improve.    Thank you for the opportunity to care for this patient.  Please do not hesitate to contact me with questions.  Nehemiah Settle, FNP [facility]

## 2023-02-01 NOTE — Patient Instructions (Signed)
1. Chronic urticaria - Your history does not have any "red flags" such as fevers, joint pains, or permanent skin changes that would be concerning for a more serious cause of hives.  - Reviewed lab results again  - Chronic hives are often times a self limited process and will "burn themselves out" over 6-12 months, although this is not always the case.  - Stop Xyzal (levocetirzine) due to side effects - In the meantime, we will continue with suppressive dosing of antihistamines:   - Morning: Zyrtec (cetirizine) 20mg  (two tablets) + Pepcid (famotidine) 20mg   - Evening: Zyrtec (cetirizine) 20mg  (two tablets) + Pepcid (famotidine) 20mg  - You can change this dosing at home, decreasing the dose as needed  - If you are not tolerating the medications or are tired of taking them every day, we can start treatment with a monthly injectable medication called Xolair.  -Caution as these medications can make you sleepy  2. Concern for antibiotic allergies - Continue to avoid NITROFURANTOIN AND BACTRIM for now until you see Dr. Dellis Anes at your next office visit  3. Schedule a follow up appointment in two weeks with Dr. Dellis Anes

## 2023-02-02 NOTE — Telephone Encounter (Signed)
Called with interpreter line to advise patient about what Dr. Dellis Anes said. However, voice mail has not been set up so a message could not be left. Will try again later today.

## 2023-02-02 NOTE — Telephone Encounter (Signed)
This was a snap shot in time, so we can check again in the future. Infections are the most common cause of this, but steroids can also cause neutrophils to increase. She might have received steroids for her urticaria (hives), which might explain that.   Malachi Bonds, MD [facility]

## 2023-02-04 ENCOUNTER — Ambulatory Visit: Payer: Medicaid Other | Admitting: Allergy

## 2023-02-10 NOTE — Telephone Encounter (Signed)
I called patient with pacific interpreter-Arabic. When I called patient I called in regards to the note Dr.G sent back. Patient was very confused as she kept on saying that she needed to know if she was allergic to the medications or not. Patient kept going back and forth with the interpreter saying that she is not able to take the medications. I informed patient that per her last AVS she is to stop xyzal and start zyrtec 2x twice a day and pepcid 2x twice a day. Patient kept saying that she was no longer taking xyzal as it caused issues for her. I informed patient I understood but the medication I was informing patient was the zyrtec. I kept going back and forth with patient/interpreter in regards to medication. Patient said that she is not doing well with the medication regimen and is still breaking out.  I informed patient that she has an upcoming appt w Dr.G on 02/16/23 to keep that appt. Patient wanted me to route back saying that her condition is just getting worse and worse she is unable to sleep,eat and that it is affecting her psychologically. Again, I went back to finishing up reviewing the note with patient to inform of why neutrophils were high. Patient asked if there was any treatment or if there was anything that could be sent in. I informed that Dr.G did not specify if there was any but that we could trend it over time to see where her levels are at.

## 2023-02-16 ENCOUNTER — Other Ambulatory Visit: Payer: Self-pay

## 2023-02-16 ENCOUNTER — Ambulatory Visit (INDEPENDENT_AMBULATORY_CARE_PROVIDER_SITE_OTHER): Payer: Medicaid Other | Admitting: Allergy & Immunology

## 2023-02-16 ENCOUNTER — Encounter: Payer: Self-pay | Admitting: Allergy & Immunology

## 2023-02-16 VITALS — BP 110/62 | HR 78 | Temp 98.0°F | Wt 294.8 lb

## 2023-02-16 DIAGNOSIS — Z881 Allergy status to other antibiotic agents status: Secondary | ICD-10-CM

## 2023-02-16 DIAGNOSIS — J3089 Other allergic rhinitis: Secondary | ICD-10-CM | POA: Diagnosis not present

## 2023-02-16 DIAGNOSIS — L508 Other urticaria: Secondary | ICD-10-CM | POA: Diagnosis not present

## 2023-02-16 DIAGNOSIS — T50905D Adverse effect of unspecified drugs, medicaments and biological substances, subsequent encounter: Secondary | ICD-10-CM

## 2023-02-16 MED ORDER — FAMOTIDINE 20 MG PO TABS: 20.0000 mg | ORAL_TABLET | Freq: Two times a day (BID) | ORAL | 1 refills | Status: AC

## 2023-02-16 MED ORDER — CETIRIZINE HCL 10 MG PO TABS: 20.0000 mg | ORAL_TABLET | Freq: Two times a day (BID) | ORAL | 1 refills | Status: DC

## 2023-02-16 NOTE — Progress Notes (Signed)
FOLLOW UP  Date of Service/Encounter:  02/16/23   Assessment:   Chronic urticaria - with normal labs (signed consent for initiation of Xolair)   Adverse drug effect - planning for nitrofurantoin and Bactrim challenges to take this out of the equation (after the initiation of the Xolair)  Perennial allergic rhinitis (dust mites)    Plan/Recommendations:   1. Chronic urticaria - Previous labs were NEGATIVE.  - Testing today was positive to DUST MITES. - Information on dust mites provided today. - Avoidance measures provided. - You cannot CLEAN YOUR WAY out of dust mites (they are EVERYWHERE). - They do not INVADE you and cause an infection!  - We are changing you to ZYRTEC instead of XYZAL.  - FOOD TESTING was NEGATIVE to the most common foods (ruling out >95% of all food allergies). - Copy of testing provided today.  - In the meantime, we will continue with suppressive dosing of antihistamines:   - Morning: Zyrtec (cetirizine) 20mg  (two tablets) + Pepcid (famotidine) 20mg   - Evening: Zyrtec (cetirizine) 20mg  (two tablets) + Pepcid (famotidine) 20mg  - You can change this dosing at home, decreasing the dose as needed  - We are going to start Xolair to help control the hives and this should allow you to wean off of the Zyrtec and Pepcid.  2. Concern for antibiotic allergies - Continue to avoid NITROFURANTOIN AND BACTRIM for now. - We will schedule the Nitrofurantoin challenge and Bactrim challenge once we have the Xolair on board.   3. Return in about 3 months (around 05/19/2023) for a nitrofurantoin challenge in the office.    Subjective:   Darlene Cruz is a 37 y.o. female presenting today for follow up of  Chief Complaint  Patient presents with   Allergy Testing   Urticaria   Pruritus    Darlene Cruz has a history of the following: Patient Active Problem List   Diagnosis Date Noted   Morbid obesity (HCC)    History of vitamin D deficiency  05/15/2019    History obtained from: chart review and patient and son and interpreter .  Darlene Cruz is a 37 y.o. female presenting for a follow up visit.  She was last seen in May 2024 for nitrofurantoin challenge.  However, this was canceled when Chrissy has some more questions and the patient reported that she had itching as well as chest pain and difficulty swallowing.  She was changed from Xyzal to Zyrtec and asked to come back to see me.  Since last visit, she has not done well.  She apparently never picked up the antihistamines that were sent in last time.  Zyrtec was sent in to replace Xyzal.  So she has been off antihistamines since May 20 and is very miserable.  Her son is with her today and he tells me that she had huge highs especially after coming home from work.  She works at [employer].  She does not eat any of the food.  She has hives nearly every day and has been very frustrated.  She is under the impression that she is getting allergy testing done today.  This was never discussed in any of the phone messages or at Chrissy's appointment.  We did do lab work for allergens that was negative.  She also would like to be tested for foods.  Foods never been a trigger for her hives, so we did not address that at previous appointments.  She is interested in starting  Xolair.  She also is wondering whether we sent the antibiotic off for testing.  I explained to her that we kept the antibiotics that we can do a challenge in the office.  We do not have any testing that we can do to the medication itself.  I think it is more clarification on the nitrofurantoin reaction.  I asked her about the chest pain and low-grade swelling.  She tells me that this is a reaction she had when she had the Xyzal that we prescribed at her first visit.  For the nitrofurantoin, she only had the hives. This was the same for the Bactrim. I clarified this multiple times.   Otherwise, there have been no changes to her past  medical history, surgical history, family history, or social history.    Review of Systems  Constitutional: Negative.  Negative for fever, malaise/fatigue and weight loss.  HENT: Negative.  Negative for congestion, ear discharge and ear pain.   Eyes:  Negative for pain, discharge and redness.  Respiratory:  Negative for cough, sputum production, shortness of breath and wheezing.   Cardiovascular: Negative.  Negative for chest pain and palpitations.  Gastrointestinal:  Negative for abdominal pain and heartburn.  Skin:  Positive for itching and rash.  Neurological:  Negative for dizziness and headaches.  Endo/Heme/Allergies:  Negative for environmental allergies. Does not bruise/bleed easily.       Objective:   Blood pressure 110/62, pulse 78, temperature 98 F (36.7 C), weight 294 lb 12.8 oz (133.7 kg), SpO2 98 %. Body mass index is 52.22 kg/m.    Physical Exam Vitals reviewed.  Constitutional:      Appearance: Normal appearance. She is well-developed. She is not ill-appearing or toxic-appearing.  HENT:     Head: Normocephalic and atraumatic.     Right Ear: Tympanic membrane, ear canal and external ear normal. No drainage, swelling or tenderness. Tympanic membrane is not injected, scarred, erythematous, retracted or bulging.     Left Ear: Tympanic membrane, ear canal and external ear normal. No drainage, swelling or tenderness. Tympanic membrane is not injected, scarred, erythematous, retracted or bulging.     Nose: No nasal deformity, septal deviation, mucosal edema or rhinorrhea.     Right Turbinates: Enlarged and swollen.     Left Turbinates: Enlarged and swollen.     Right Sinus: No maxillary sinus tenderness or frontal sinus tenderness.     Left Sinus: No maxillary sinus tenderness or frontal sinus tenderness.     Mouth/Throat:     Mouth: Mucous membranes are not pale and not dry.     Pharynx: Uvula midline.  Eyes:     General:        Right eye: No discharge.         Left eye: No discharge.     Conjunctiva/sclera: Conjunctivae normal.     Right eye: Right conjunctiva is not injected. No chemosis.    Left eye: Left conjunctiva is not injected. No chemosis.    Pupils: Pupils are equal, round, and reactive to light.  Cardiovascular:     Rate and Rhythm: Normal rate and regular rhythm.     Heart sounds: Normal heart sounds.  Pulmonary:     Effort: Pulmonary effort is normal. No tachypnea, accessory muscle usage or respiratory distress.     Breath sounds: Normal breath sounds. No wheezing, rhonchi or rales.  Chest:     Chest wall: No tenderness.  Lymphadenopathy:     Head:     Right  side of head: No submandibular, tonsillar or occipital adenopathy.     Left side of head: No submandibular, tonsillar or occipital adenopathy.     Cervical: No cervical adenopathy.  Skin:    General: Skin is warm.     Capillary Refill: Capillary refill takes less than 2 seconds.     Coloration: Skin is not pale.     Findings: Rash present. No abrasion, erythema or petechiae. Rash is not papular, urticarial or vesicular.     Comments: She does have some excoriations on her bilateral arms.  She has some excoriations on her upper back where her most recent urticaria had popped up.  Neurological:     Mental Status: She is alert.  Psychiatric:        Behavior: Behavior is cooperative.      Diagnostic studies:   Allergy Studies:     Airborne Adult Perc - 02/16/23 1800     Time Antigen Placed 1610    Allergen Manufacturer Waynette Buttery    Location Back    Number of Test --   55   1. Control-Buffer 50% Glycerol Negative    2. Control-Histamine 2+    3. Bahia Negative    4. French Southern Territories Negative    5. Johnson Negative    6. Kentucky Blue Negative    7. Meadow Fescue Negative    8. Perennial Rye Negative    9. Timothy Negative    10. Ragweed Mix Negative    11. Cocklebur Negative    12. Plantain,  English Negative    13. Baccharis Negative    14. Dog Fennel Negative    15.  Russian Thistle Negative    16. Lamb's Quarters Negative    17. Sheep Sorrell Negative    18. Rough Pigweed Negative    19. Marsh Elder, Rough Negative    20. Mugwort, Common Negative    21. Box, Elder Negative    22. Cedar, red Negative    23. Sweet Gum Negative    24. Pecan Pollen Negative    25. Pine Mix Negative    26. Walnut, Black Pollen Negative    27. Red Mulberry Negative    28. Ash Mix Negative    29. Birch Mix Negative    30. Beech American Negative    31. Cottonwood, Guinea-Bissau Negative    32. Hickory, White Negative    33. Maple Mix Negative    34. Oak, [place] Mix Negative    35. Sycamore Eastern Negative    36. Alternaria Alternata Negative    37. Cladosporium Herbarum Negative    38. Aspergillus Mix Negative    39. Penicillium Mix Negative    40. Bipolaris Sorokiniana (Helminthosporium) Negative    41. Drechslera Spicifera (Curvularia) Negative    42. Mucor Plumbeus Negative    43. Fusarium Moniliforme Negative    44. Aureobasidium Pullulans (pullulara) Negative    45. Rhizopus Oryzae Negative    46. Botrytis Cinera Negative    47. Epicoccum Nigrum Negative    48. Phoma Betae Negative    49. Dust Mite Mix 4+    50. Cat Hair 10,000 BAU/ml Negative    51.  Dog Epithelia Negative    52. Mixed Feathers Negative    53. Horse Epithelia Negative    54. Cockroach, German Negative    55. Tobacco Leaf Negative             Food Adult Perc - 02/16/23 1800     Time Antigen  Placed 1802    Allergen Manufacturer Greer    Location Back    Number of allergen test 17    1. Peanut Negative    2. Soybean Negative    3. Wheat Negative    4. Sesame Negative    5. Milk, Cow Negative    6. Casein Negative    7. Egg White, Chicken Negative    8. Shellfish Mix Negative    9. Fish Mix Negative    10. Cashew Negative    11. Walnut Food Negative    12. Almond Negative    13. Hazelnut Negative    14. Pecan Food Negative    15. Pistachio Negative    16. [place] Nut  Negative    17. Coconut Negative             Allergy testing results were read and interpreted by myself, documented by clinical staff.      Malachi Bonds, MD  [facility]

## 2023-02-16 NOTE — Patient Instructions (Addendum)
1. Chronic urticaria - Previous labs were NEGATIVE.  - Testing today was positive to DUST MITES. - Information on dust mites provided today. - Avoidance measures provided. - You cannot CLEAN YOUR WAY out of dust mites (they are EVERYWHERE). - They do not INVADE you and cause an infection!  - We are changing you to ZYRTEC instead of XYZAL.  - FOOD TESTING was NEGATIVE to the most common foods (ruling out >95% of all food allergies). - Copy of testing provided today.  - In the meantime, we will continue with suppressive dosing of antihistamines:   - Morning: Zyrtec (cetirizine) 20mg  (two tablets) + Pepcid (famotidine) 20mg   - Evening: Zyrtec (cetirizine) 20mg  (two tablets) + Pepcid (famotidine) 20mg  - You can change this dosing at home, decreasing the dose as needed  - We are going to start Xolair to help control the hives and this should allow you to wean off of the Zyrtec and Pepcid.  2. Concern for antibiotic allergies - Continue to avoid NITROFURANTOIN AND BACTRIM for now. - We will schedule the Nitrofurantoin challenge and Bactrim challenge once we have the Xolair on board.   3. Return in about 3 months (around 05/19/2023) for a nitrofurantoin challenge in the office.    Please inform us of any Emergency Department visits, hospitalizations, or changes in symptoms. Call us before going to the ED for breathing or allergy symptoms since we might be able to fit you in for a sick visit. Feel free to contact us anytime with any questions, problems, or concerns.  It was a pleasure to see you and your family again today!  Websites that have reliable patient information: 1. American Academy of Asthma, Allergy, and Immunology: www.aaaai.org 2. Food Allergy Research and Education (FARE): foodallergy.org 3. Mothers of Asthmatics: http://www.asthmacommunitynetwork.org 4. American College of Allergy, Asthma, and Immunology: www.acaai.org   COVID-19 Vaccine Information can be found at:  [email or web address] For questions related to vaccine distribution or appointments, please email [email or web address] or call [phone].   We realize that you might be concerned about having an allergic reaction to the COVID19 vaccines. To help with that concern, WE ARE OFFERING THE COVID19 VACCINES IN OUR OFFICE! Ask the front desk for dates!     "Like" [place] on Facebook and Instagram for our latest updates!      A healthy democracy works best when [employer] participate! Make sure you are registered to vote! If you have moved or changed any of your contact information, you will need to get this updated before voting!  In some cases, you MAY be able to register to vote online: [email or web address]       Airborne Adult Perc - 02/16/23 1800     Time Antigen Placed 1802    Allergen Manufacturer Waynette Buttery    Location Back    Number of Test --   55   1. Control-Buffer 50% Glycerol Negative    2. Control-Histamine 2+    3. Bahia Negative    4. French Southern Territories Negative    5. Johnson Negative    6. Kentucky Blue Negative    7. Meadow Fescue Negative    8. Perennial Rye Negative    9. Timothy Negative    10. Ragweed Mix Negative    11. Cocklebur Negative    12. Plantain,  English Negative    13. Baccharis Negative    14. Dog Fennel Negative    15. Russian Thistle Negative    16. Lamb's Quarters Negative    17. Sheep  Sorrell Negative    18. Rough Pigweed Negative    19. Marsh Elder, Rough Negative    20. Mugwort, Common Negative    21. Box, Elder Negative    22. Cedar, red Negative    23. Sweet Gum Negative    24. Pecan Pollen Negative    25. Pine Mix Negative    26. Walnut, Black Pollen Negative    27. Red Mulberry Negative    28. Ash Mix Negative    29. Birch Mix Negative    30. Beech American Negative    31. Cottonwood, Guinea-Bissau Negative    32. Hickory, White Negative    33. Maple Mix Negative     34. Oak, [place] Mix Negative    35. Sycamore Eastern Negative    36. Alternaria Alternata Negative    37. Cladosporium Herbarum Negative    38. Aspergillus Mix Negative    39. Penicillium Mix Negative    40. Bipolaris Sorokiniana (Helminthosporium) Negative    41. Drechslera Spicifera (Curvularia) Negative    42. Mucor Plumbeus Negative    43. Fusarium Moniliforme Negative    44. Aureobasidium Pullulans (pullulara) Negative    45. Rhizopus Oryzae Negative    46. Botrytis Cinera Negative    47. Epicoccum Nigrum Negative    48. Phoma Betae Negative    49. Dust Mite Mix 4+    50. Cat Hair 10,000 BAU/ml Negative    51.  Dog Epithelia Negative    52. Mixed Feathers Negative    53. Horse Epithelia Negative    54. Cockroach, German Negative    55. Tobacco Leaf Negative             Food Adult Perc - 02/16/23 1800     Time Antigen Placed 5621    Allergen Manufacturer Waynette Buttery    Location Back    Number of allergen test 17    1. Peanut Negative    2. Soybean Negative    3. Wheat Negative    4. Sesame Negative    5. Milk, Cow Negative    6. Casein Negative    7. Egg White, Chicken Negative    8. Shellfish Mix Negative    9. Fish Mix Negative    10. Cashew Negative    11. Walnut Food Negative    12. Almond Negative    13. Hazelnut Negative    14. Pecan Food Negative    15. Pistachio Negative    16. [place] Nut Negative    17. Coconut Negative             Control of Dust Mite Allergen    Dust mites play a major role in allergic asthma and rhinitis.  They occur in environments with high humidity wherever human skin is found.  Dust mites absorb humidity from the atmosphere (ie, they do not drink) and feed on organic matter (including shed human and animal skin).  Dust mites are a microscopic type of insect that you cannot see with the naked eye.  High levels of dust mites have been detected from mattresses, pillows, carpets, upholstered furniture, bed covers, clothes,  soft toys and any woven material.  The principal allergen of the dust mite is found in its feces.  A gram of dust may contain 1,000 mites and 250,000 fecal particles.  Mite antigen is easily measured in the air during house cleaning activities.  Dust mites do not bite and do not cause harm to humans, other than by triggering  allergies/asthma.    Ways to decrease your exposure to dust mites in your home:  Encase mattresses, box springs and pillows with a mite-impermeable barrier or cover   Wash sheets, blankets and drapes weekly in hot water (130 F) with detergent and dry them in a dryer on the hot setting.  Have the room cleaned frequently with a vacuum cleaner and a damp dust-mop.  For carpeting or rugs, vacuuming with a vacuum cleaner equipped with a high-efficiency particulate air (HEPA) filter.  The dust mite allergic individual should not be in a room which is being cleaned and should wait 1 hour after cleaning before going into the room. Do not sleep on upholstered furniture (eg, couches).   If possible removing carpeting, upholstered furniture and drapery from the home is ideal.  Horizontal blinds should be eliminated in the rooms where the person spends the most time (bedroom, study, television room).  Washable vinyl, roller-type shades are optimal. Remove all non-washable stuffed toys from the bedroom.  Wash stuffed toys weekly like sheets and blankets above.   Reduce indoor humidity to less than 50%.  Inexpensive humidity monitors can be purchased at most hardware stores.  Do not use a humidifier as can make the problem worse and are not recommended.

## 2023-02-17 ENCOUNTER — Encounter: Payer: Self-pay | Admitting: Allergy & Immunology

## 2023-02-18 ENCOUNTER — Telehealth: Payer: Self-pay | Admitting: *Deleted

## 2023-02-18 MED ORDER — MONTELUKAST SODIUM 10 MG PO TABS: 10.0000 mg | ORAL_TABLET | Freq: Every day | ORAL | 5 refills | Status: AC

## 2023-02-18 NOTE — Addendum Note (Signed)
Addended by: Alfonse Spruce on: 02/18/2023 06:37 PM   Modules accepted: Orders

## 2023-02-18 NOTE — Telephone Encounter (Addendum)
Per Ins patient has to try and faile two antihistamines and montelukast for Xolair for [facility] approval  Dellis Anes, Hetty Ely, MD  Devoria Glassing, CMA Xolair consent signed for CI[facility]. Best of luck. Interpreter needed, although her son does speak [place]. He is a bit ... abrupt in his manner of speaking.

## 2023-02-18 NOTE — Telephone Encounter (Signed)
Ok she was on levocetirizine and is now on cetirizine. That is two antihistamines.  Let's add on montelukast 10mg  daily. She can call us in one week with an update so we can get this approved. I sent the script to [facility].  Can someone call to let the patient know the new plan? I guess you might have to ask for the patient's son...   Malachi Bonds, MD [facility]

## 2023-02-19 NOTE — Telephone Encounter (Signed)
Tried calling pt but pt does not have a voicemail box set up yet

## 2023-02-22 NOTE — Telephone Encounter (Signed)
Attempted to call through Arabic interpreter. There was no answer and no voicemail was set up. Will need to attempt to call again.

## 2023-05-20 ENCOUNTER — Encounter: Payer: Medicaid Other | Admitting: Allergy & Immunology

## 2023-06-01 ENCOUNTER — Ambulatory Visit (INDEPENDENT_AMBULATORY_CARE_PROVIDER_SITE_OTHER): Payer: Medicaid Other | Admitting: Podiatry

## 2023-06-01 ENCOUNTER — Encounter: Payer: Self-pay | Admitting: Podiatry

## 2023-06-01 ENCOUNTER — Ambulatory Visit (INDEPENDENT_AMBULATORY_CARE_PROVIDER_SITE_OTHER): Payer: Medicaid Other

## 2023-06-01 DIAGNOSIS — M722 Plantar fascial fibromatosis: Secondary | ICD-10-CM

## 2023-06-01 DIAGNOSIS — M778 Other enthesopathies, not elsewhere classified: Secondary | ICD-10-CM

## 2023-06-01 DIAGNOSIS — M7752 Other enthesopathy of left foot: Secondary | ICD-10-CM

## 2023-06-01 MED ORDER — MELOXICAM 15 MG PO TABS: 15.0000 mg | ORAL_TABLET | Freq: Every day | ORAL | 3 refills | Status: DC

## 2023-06-01 MED ORDER — TRIAMCINOLONE ACETONIDE 40 MG/ML IJ SUSP
20.0000 mg | Freq: Once | INTRAMUSCULAR | Status: AC
  Administered 2023-06-01: 20 mg

## 2023-06-01 MED ORDER — METHYLPREDNISOLONE 4 MG PO TBPK: ORAL_TABLET | ORAL | 0 refills | Status: DC

## 2023-06-01 NOTE — Patient Instructions (Signed)

## 2023-06-01 NOTE — Progress Notes (Signed)
Subjective:  Patient ID: Darlene Cruz, female    DOB: 1986-01-17,  MRN: [id number] HPI Chief Complaint  Patient presents with   Foot Pain    Plantar heel and medial ankle left - aching, swelling x 1-2 years, was more intermittent, but the last 2 months has been constant, PCP referred to a place that does insoles? - just did an exam, no treatment   New Patient (Initial Visit)    37 y.o. female presents with the above complaint.   ROS: Denies fever chills nausea vomit muscle aches pains calf pain back pain chest pain shortness of breath  Past Medical History:  Diagnosis Date   Pelvic pain in female 03/12/2016   Past Surgical History:  Procedure Laterality Date   CESAREAN SECTION  2011   approx   DILATION AND CURETTAGE OF UTERUS  2009   missed ab   KNEE SURGERY Left 2010   LAPAROSCOPIC OVARIAN CYSTECTOMY Bilateral 10/24/2021   Procedure: DIAGNOSTIC LAPROSCOPY/ PERITONEAL BIOPSIES;  Surgeon: Osborn Coho, MD;  Location: [facility];  Service: Gynecology;  Laterality: Bilateral;   TONSILLECTOMY AND ADENOIDECTOMY     age 51    Current Outpatient Medications:    Cholecalciferol 1.25 MG (50000 UT) capsule, Take by mouth., Disp: , Rfl:    Darlene Cruz FE 1/20 1-20 MG-MCG tablet, Take 1 tablet by mouth daily., Disp: , Rfl:    meloxicam (MOBIC) 15 MG tablet, Take 1 tablet (15 mg total) by mouth daily., Disp: 30 tablet, Rfl: 3   methylPREDNISolone (MEDROL DOSEPAK) 4 MG TBPK tablet, 6 day dose pack - take as directed, Disp: 21 tablet, Rfl: 0   montelukast (SINGULAIR) 10 MG tablet, Take 1 tablet (10 mg total) by mouth at bedtime., Disp: 30 tablet, Rfl: 5   cetirizine (ZYRTEC) 10 MG tablet, Take 2 tablets (20 mg total) by mouth 2 (two) times daily., Disp: 180 tablet, Rfl: 1   Cholecalciferol (VITAMIN D PO), Take 1 tablet by mouth once a week. Every 5 days, Disp: , Rfl:    diphenhydrAMINE (BENADRYL) 25 MG tablet, Take 1 tablet (25 mg total) by mouth every 6 (six) hours.,  Disp: 20 tablet, Rfl: 0   famotidine (PEPCID) 20 MG tablet, Take 1 tablet (20 mg total) by mouth 2 (two) times daily., Disp: 180 tablet, Rfl: 1   ibuprofen (ADVIL) 600 MG tablet, Take 1 tablet (600 mg total) by mouth every 6 (six) hours as needed., Disp: 30 tablet, Rfl: 1   levocetirizine (XYZAL) 5 MG tablet, Take 2 tablets (10 mg total) by mouth in the morning., Disp: 30 tablet, Rfl: 5   mirtazapine (REMERON) 15 MG tablet, Take 15 mg by mouth at bedtime., Disp: , Rfl:    mupirocin ointment (BACTROBAN) 2 %, Apply 1 Application topically 2 (two) times daily., Disp: 22 g, Rfl: 0   naproxen (NAPROSYN) 375 MG tablet, Take 1 tablet (375 mg total) by mouth 2 (two) times daily., Disp: 20 tablet, Rfl: 0   predniSONE (DELTASONE) 20 MG tablet, Take 40 mg by mouth daily for 3 days, then 20mg  by mouth daily for 3 days, then 10mg  daily for 3 days (Patient not taking: Reported on 02/01/2023), Disp: 12 tablet, Rfl: 0   tiZANidine (ZANAFLEX) 4 MG tablet, Take 1 tablet (4 mg total) by mouth every 8 (eight) hours as needed for muscle spasms., Disp: 21 tablet, Rfl: 0  No Known Allergies Review of Systems Objective:  There were no vitals filed for this visit.  General: Well developed,  nourished, in no acute distress, alert and oriented x3   Dermatological: Skin is warm, dry and supple bilateral. Nails x 10 are well maintained; remaining integument appears unremarkable at this time. There are no open sores, no preulcerative lesions, no rash or signs of infection present.  Vascular: Dorsalis Pedis artery and Posterior Tibial artery pedal pulses are 2/4 bilateral with immedate capillary fill time. Pedal hair growth present. No varicosities and no lower extremity edema present bilateral.   Neruologic: Grossly intact via light touch bilateral. Vibratory intact via tuning fork bilateral. Protective threshold with Semmes Wienstein monofilament intact to all pedal sites bilateral. Patellar and Achilles deep tendon reflexes  2+ bilateral. No Babinski or clonus noted bilateral.   Musculoskeletal: No gross boney pedal deformities bilateral. No pain, crepitus, or limitation noted with foot and ankle range of motion bilateral. Muscular strength 5/5 in all groups tested bilateral.  Gait: Unassisted, Nonantalgic.    Radiographs:  Radiographs taken of the left heel demonstrate osseously mature individual small plantar distally or calcaneal spur some soft tissue increase in density plan for smoking insertion site Achilles appears to be intact as do the anterior tendon groups.  Assessment & Plan:   Assessment: Planter fasciitis left  Plan: Started her on methylprednisolone to be followed by meloxicam.  Injected the left heel today.  Discussed appropriate shoe gear stretching exercise ice therapy sugar modifications.  Follow-up with her in 1 month     [name] T. Mercedes, [place]

## 2023-07-01 ENCOUNTER — Encounter: Payer: Self-pay | Admitting: Podiatrist

## 2023-07-01 ENCOUNTER — Ambulatory Visit: Payer: Medicaid Other | Admitting: Podiatrist

## 2023-07-01 DIAGNOSIS — M7750 Other enthesopathy of unspecified foot: Secondary | ICD-10-CM

## 2023-07-01 DIAGNOSIS — M722 Plantar fascial fibromatosis: Secondary | ICD-10-CM | POA: Diagnosis not present

## 2023-07-01 MED ORDER — CYCLOBENZAPRINE HCL 5 MG PO TABS: 5.0000 mg | ORAL_TABLET | Freq: Two times a day (BID) | ORAL | 1 refills | Status: DC | PRN

## 2023-07-01 NOTE — Patient Instructions (Signed)
Try hiking boots-  they will need to go over your ankle to provide support for your tendons.  [employer], [employer], [employer] and other outdoor supply stores carry good hiking boots.    Posterior Tibial Tendinitis Posterior tibial tendinitis is irritation of a tendon called the posterior tibial tendon. Your posterior tibial tendon is a cord-like tissue that connects calf muscles to your foot. They work together to: Human resources officer. Help you rise up on your toes. Help you turn your foot down and in (inversion). This condition causes foot and ankle pain. It can also lead to a flat foot. What are the causes? This condition is most often caused by repeated stress to the tendon (overuse injury). It can also be caused by a sudden injury that stresses the tendon, such as landing on your foot after jumping or falling. What increases the risk? This condition is more likely to develop in: People who play a sport that involves putting a lot of pressure on the feet, such as basketball, tennis, soccer, and hockey. Runners. Females who are older than 37 years of age and are overweight. People with diabetes. People with decreased foot stability. People with flat feet. What are the signs or symptoms? Symptoms include: Pain in the inner ankle. Pain at the arch of your foot. Pain that gets worse with running, walking, or standing. Swelling on the inside of your ankle and foot. Weakness in your ankle or foot. Inability to stand up on tiptoe. Flattening of the arch of your foot. How is this diagnosed? This condition may be diagnosed based on: Your symptoms. Your medical history. A physical exam. Tests, such as X-ray, MRI, or ultrasound. How is this treated? This condition may be treated by: Putting ice on the injured area. Taking NSAIDs, such as ibuprofen, to reduce pain and swelling. Wearing a special shoe or shoe insert to support your arch (orthotic). Having physical therapy. Replacing  high-impact exercise with low-impact exercise, such as swimming or cycling. If your symptoms do not improve with these treatments, you may need to wear a splint, removable walking boot, or short leg cast for 6-8 weeks to keep your foot and ankle still (immobilized). Follow these instructions at home: If you have a nonremovable cast or splint: Do not put pressure on any part of the cast or splint until it is fully hardened. This may take several hours. Do not stick anything inside the cast or splint to scratch your skin. Doing that increases your risk of infection. Check the skin around the cast or splint every day. Tell your health care provider about any concerns. You may put lotion on dry skin around the edges of the cast or splint. Do not put lotion on the skin underneath it. Keep the cast or splint clean and dry. If you have a removable boot: Wear the boot as told by your provider. Remove it only as told by your provider. Check the skin around the boot every day. Tell your provider about any concerns. Loosen the boot if your toes tingle, become numb, or turn cold and blue. Keep the boot clean and dry. Bathing Do not take baths, swim, or use a hot tub until your provider approves. Ask your provider if you may take showers. If your cast, splint, or boot is not waterproof: Do not let it get wet. Cover it with a waterproof covering while you take a bath or a shower. Managing pain and swelling  If told, put ice on the injured  area. If you have a removable boot, remove it as told by your provider. Put ice in a plastic bag. Place a towel between your skin and the bag or between your cast and the bag. Leave the ice on for 20 minutes, 2-3 times a day. If your skin turns bright red, remove the ice right away to prevent skin damage. The risk of damage is higher if you cannot feel pain, heat, or cold. Move your toes often to reduce stiffness and swelling. Raise (elevate) the injured area above  the level of your heart while you are sitting or lying down. Activity Do not use the injured foot to support your body weight until your provider says that you can. Use crutches as told by your provider. Do not do activities that make pain or swelling worse. Ask your provider when it is safe to drive if you have a cast, splint, or boot on your foot. Do exercises as told by your provider. Return to your normal activities as told by your provider. Ask your provider what activities are safe for you. General instructions Take over-the-counter and prescription medicines only as told by your provider. If you have an orthotic, use it as told by your provider. How is this prevented? Wear footwear that is appropriate for your athletic activity. Avoid athletic activities that cause pain or swelling in your ankle or foot. Warm up and stretch before being active. Stop activity if you develop pain or swelling while training. See your provider if you have pain or swelling that does not improve after a few days of rest. Start a new athletic activity slowly so you can build up your strength and flexibility. Contact a health care provider if: Your symptoms get worse. Your symptoms do not improve in 6-8 weeks. You develop new, unexplained symptoms. Your splint, boot, or cast gets damaged. This information is not intended to replace advice given to you by your health care provider. Make sure you discuss any questions you have with your health care provider. Document Revised: 09/15/2022 Document Reviewed: 09/15/2022 Elsevier Patient Education  2024 [employer].

## 2023-07-01 NOTE — Progress Notes (Signed)
  Chief Complaint  Patient presents with   Plantar Fasciitis    LEFT PF, THE INJECTION HELPED FOR ABOUT 10 DAYS THEN PAIN CAME BACK, PAIN IS NOW LOCATED ON THE MEDIAL SIDE OF FOOT, LEVEL PAIN 5/10 WHEN RELAXING AND WORK PAIN IS 10/10      HPI: Patient is 37 y.o. female who presents today for follow-up left foot pain.  During today's visit we are communicating via a Nurse, learning disability.  The patient states that her left foot heel pain is improved however she still has pain on the medial aspect of the left foot and ankle.  Relates when she is working the pain is a 10 out of 10.  She states the injection at last visit above her foot however the medial part of her foot is still painful.  She does not want another injection at today's visit.   No Known Allergies  Review of systems is negative except as noted in the HPI.  Denies nausea/ vomiting/ fevers/ chills or night sweats.   Denies difficulty breathing, denies calf pain or tenderness  Physical Exam  Patient is awake, alert, and oriented x 3.  In no acute distress.    Vascular status is intact with palpable pedal pulses DP and PT bilateral and capillary refill time less than 3 seconds bilateral.  No edema or erythema noted.   Neurological exam reveals epicritic and protective sensation grossly intact bilateral.   Dermatological exam reveals skin is supple and dry to bilateral feet.  No open lesions present.    Musculoskeletal exam: Pain along posterior tibial tendon at its insertion and course of the tendon is noted of the left foot.  Insertional plantar fascial pain has improved significantly from the last visit.   Assessment:   ICD-10-CM   1. Plantar fasciitis  M72.2 Ambulatory referral to Physical Therapy    2. Tendonitis of ankle or foot  M77.50 Ambulatory referral to Physical Therapy       Plan: Discussed conservative treatment options and recommendations.  She already has a boot and relates that she will start wearing that more  consistently.  Relates specific muscle pain in this area therefore I rx'd prescription for cyclobenzaprine to try.  Discussed physical therapy-  submitted an order for physical therapy to start at her convenience.  She will be seen back in 1 month with Dr. Al Corpus at that point she stated she would  be amenable to an injection if needed.

## 2023-08-17 ENCOUNTER — Encounter: Payer: Self-pay | Admitting: Podiatry

## 2023-08-17 ENCOUNTER — Ambulatory Visit (INDEPENDENT_AMBULATORY_CARE_PROVIDER_SITE_OTHER): Payer: Medicaid Other | Admitting: Podiatry

## 2023-08-17 DIAGNOSIS — S93692A Other sprain of left foot, initial encounter: Secondary | ICD-10-CM

## 2023-08-17 DIAGNOSIS — S86312A Strain of muscle(s) and tendon(s) of peroneal muscle group at lower leg level, left leg, initial encounter: Secondary | ICD-10-CM

## 2023-08-17 NOTE — Progress Notes (Signed)
She presents today for follow-up of her plantar fasciitis of her left foot.  States the injection I gave her a time before last only helped for about 10 days.  She states that after she saw Dr. Donzetta Kohut she really has not felt any improvement.  States that matter fact is starting to feel worse as she is having pain to the lateral aspect of the foot going up the lateral aspect of her left leg.  She discusses this today with vascular and interpreter.  Objective: Vital signs are stable alert oriented x 3.  Pulses are palpable.  There is no erythema edema cellulitis drainage or odor with exception of the lateral aspect of the ankle which does demonstrate some tenderness on palpation of the peroneal tendons and some swelling there.  She has some tenderness on palpation medial calcaneal tubercle and some warmth on palpation to this area.  This very well could be consistent with a tear of the plantar fascia or the peroneal tendons.  Assessment: Cannot rule out a tear of the peroneal tendons or the plantar fascia left.  Plan: At this point we are going to request a MRI of the left ankle and peroneal tendon area and plantar fascia for surgical evaluation and differential diagnoses.

## 2023-09-02 ENCOUNTER — Encounter: Payer: Self-pay | Admitting: Podiatry

## 2023-09-06 ENCOUNTER — Ambulatory Visit
Admission: RE | Admit: 2023-09-06 | Discharge: 2023-09-06 | Disposition: A | Discharge status: Home / Self Care | Payer: Medicaid Other | Source: Ambulatory Visit | Attending: Podiatry | Admitting: Podiatry

## 2023-09-06 DIAGNOSIS — S86312A Strain of muscle(s) and tendon(s) of peroneal muscle group at lower leg level, left leg, initial encounter: Secondary | ICD-10-CM

## 2023-09-06 DIAGNOSIS — S93692A Other sprain of left foot, initial encounter: Secondary | ICD-10-CM

## 2023-09-29 ENCOUNTER — Telehealth: Payer: Self-pay | Admitting: Podiatry

## 2023-09-29 NOTE — Telephone Encounter (Signed)
 Called pt to schedule appt with Dr Lara Plants for [facility] results and pts voicemail is not set up so not able to leave message. She does not have my chart either.

## 2023-09-29 NOTE — Telephone Encounter (Signed)
-----   Message from Pheobe Brass sent at 09/24/2023 11:12 AM EST ----- See Lara Plants to discuss results of MRI ----- Message ----- From: Evertt Hoe, DPM Sent: 09/24/2023   9:20 AM EST To: Ashley E Prevette, PMAC  Several areas of abnormality seen on MRI, may require surgical intervention. Recommend follow up in office to discuss further. ----- Message ----- From: Sanda Crome, PMAC Sent: 09/24/2023   9:01 AM EST To: Evertt Hoe, DPM  Could you please review this MRI for Dr. Lara Plants... thank you! ----- Message ----- From: Interface, Rad Results In Sent: 09/21/2023   8:51 PM EST To: Clemetine Cypher, DPM

## 2023-10-04 ENCOUNTER — Ambulatory Visit
Admission: EM | Admit: 2023-10-04 | Discharge: 2023-10-04 | Disposition: A | Discharge status: Home / Self Care | Payer: Medicaid Other | Attending: Emergency Medicine | Admitting: Emergency Medicine

## 2023-10-04 DIAGNOSIS — J069 Acute upper respiratory infection, unspecified: Secondary | ICD-10-CM | POA: Diagnosis not present

## 2023-10-04 MED ORDER — CYCLOBENZAPRINE HCL 10 MG PO TABS: 10.0000 mg | ORAL_TABLET | Freq: Two times a day (BID) | ORAL | 0 refills | Status: DC | PRN

## 2023-10-04 MED ORDER — BENZONATATE 100 MG PO CAPS: 100.0000 mg | ORAL_CAPSULE | Freq: Three times a day (TID) | ORAL | 0 refills | Status: AC | PRN

## 2023-10-04 NOTE — ED Triage Notes (Signed)
Pt presents with c/o sore throat, fever, cough and body aches X 3 days.  Home interventions: ibuprofen, tylenol

## 2023-10-04 NOTE — ED Provider Notes (Signed)
[facility]    CSN: [id number] Arrival date & time: 10/04/23  1034      History   Chief Complaint Chief Complaint  Patient presents with   Sore Throat   Fever   Cough   Generalized Body Aches    HPI Darlene Cruz is a 38 y.o. female.  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   Pain in both feet 01/27/2022   Polyarthralgia 01/27/2022   Trying to get pregnant 01/27/2022   Morbid obesity (HCC)    History of vitamin D deficiency 05/15/2019   Vitamin D deficiency 05/15/2019    Past Surgical History:  Procedure Laterality Date   CESAREAN SECTION  2011   approx   DILATION AND CURETTAGE OF UTERUS  2009   missed ab   KNEE SURGERY Left 2010   LAPAROSCOPIC OVARIAN CYSTECTOMY Bilateral 10/24/2021   Procedure: DIAGNOSTIC LAPROSCOPY/ PERITONEAL BIOPSIES;  Surgeon: Osborn Coho, MD;  Location: [facility];  Service: Gynecology;  Laterality: Bilateral;   TONSILLECTOMY AND ADENOIDECTOMY     age 42    OB History     Gravida  6   Para  1   Term  1   Preterm  0   AB  5   Living  1      SAB  5   IAB  0   Ectopic  0   Multiple  0   Live Births  1            Home Medications    Prior to Admission medications   Medication Sig Start Date End Date Taking? Authorizing Provider  benzonatate (TESSALON) 100 MG capsule Take 1 capsule (100 mg total) by mouth 3 (three) times daily as needed for cough. 10/04/23  Yes Kahli Mayon, Lurena Joiner, PA-C  cyclobenzaprine (FLEXERIL) 10  MG tablet Take 1 tablet (10 mg total) by mouth 2 (two) times daily as needed for muscle spasms. 10/04/23  Yes Amaro Mangold, Lurena Joiner, PA-C  montelukast (SINGULAIR) 10 MG tablet Take 1 tablet (10 mg total) by mouth at bedtime. 02/18/23   Alfonse Spruce, MD  cetirizine (ZYRTEC) 10 MG tablet Take 2 tablets (20 mg total) by mouth 2 (two) times daily. 02/16/23   Alfonse Spruce, MD  Cholecalciferol (VITAMIN D PO) Take 1 tablet by mouth once a week. Every 5 days    [provider]  Cholecalciferol 1.25 MG (50000 UT) capsule Take by mouth. 05/10/23   [provider]  diphenhydrAMINE (BENADRYL) 25 MG tablet Take 1 tablet (25 mg total) by mouth every 6 (six) hours. 12/14/22   Garlon Hatchet, PA-C  famotidine (PEPCID) 20 MG tablet Take 1 tablet (20 mg total) by mouth 2 (two) times daily. 02/16/23   Alfonse Spruce, MD  HAILEY FE 1/20 1-20 MG-MCG tablet Take 1 tablet by mouth daily. 02/18/23   [provider]  ibuprofen (ADVIL) 600 MG tablet Take 1 tablet (600 mg total) by mouth every 6 (six) hours as needed. 10/24/21  Osborn Coho, MD  levocetirizine (XYZAL) 5 MG tablet Take 2 tablets (10 mg total) by mouth in the morning. 12/17/22 01/16/23  Alfonse Spruce, MD  meloxicam (MOBIC) 15 MG tablet Take 1 tablet (15 mg total) by mouth daily. 06/01/23   Hyatt, Max T, DPM  metFORMIN (GLUCOPHAGE) 500 MG tablet  06/09/23   [provider]  methylPREDNISolone (MEDROL DOSEPAK) 4 MG TBPK tablet 6 day dose pack - take as directed 06/01/23   Hyatt, Max T, DPM  mirtazapine (REMERON) 15 MG tablet Take 15 mg by mouth at bedtime. 11/04/20   [provider]  misoprostol (CYTOTEC) 100 MCG tablet Place 1 tablet intravaginally the night before your scheduled procedure. 06/09/23   [provider]  mupirocin ointment (BACTROBAN) 2 % Apply 1 Application topically 2 (two) times daily. 08/19/22   Raspet, Noberto Retort, PA-C  naproxen (NAPROSYN) 375 MG tablet Take 1 tablet (375 mg total) by  mouth 2 (two) times daily. 08/19/22   Raspet, Noberto Retort, PA-C  norethindrone (AYGESTIN) 5 MG tablet Take by mouth. 08/02/23 08/01/24  [provider]  predniSONE (DELTASONE) 20 MG tablet Take 40 mg by mouth daily for 3 days, then 20mg  by mouth daily for 3 days, then 10mg  daily for 3 days Patient not taking: Reported on 02/01/2023 12/14/22   Garlon Hatchet, PA-C    Family History Family History  Problem Relation Age of Onset   Hypertension Mother    Diabetes Mother    Hyperlipidemia Mother     Social History Social History   Tobacco Use   Smoking status: Former    Current packs/day: 0.20    Average packs/day: 0.2 packs/day for 5.0 years (1.0 ttl pk-yrs)    Types: Cigarettes   Smokeless tobacco: Never   Tobacco comments:    quit July 2016   Substance Use Topics   Alcohol use: No    Alcohol/week: 0.0 standard drinks of alcohol   Drug use: No     Allergies   Patient has no known allergies.   Review of Systems Review of Systems Per HPI  Physical Exam Triage Vital Signs ED Triage Vitals  Encounter Vitals Group     BP 10/04/23 1204 135/81     Systolic BP Percentile --      Diastolic BP Percentile --      Pulse Rate 10/04/23 1204 83     Resp 10/04/23 1204 17     Temp 10/04/23 1204 98.6 F (37 C)     Temp Source 10/04/23 1204 Oral     SpO2 10/04/23 1204 96 %     Weight --      Height --      Head Circumference --      Peak Flow --      Pain Score 10/04/23 1203 3     Pain Loc --      Pain Education --      Exclude from Growth Chart --    No data found.  Updated Vital Signs BP 135/81 (BP Location: Left Arm)   Pulse 83   Temp 98.6 F (37 C) (Oral)   Resp 17   LMP  (Exact Date)   SpO2 96%   Physical Exam Vitals and nursing note reviewed.  Constitutional:      General: She is not in acute distress.    Appearance: She is not ill-appearing.  HENT:     Right Ear: Tympanic membrane and ear canal normal.     Left  Ear: Tympanic membrane and ear canal  normal.     Nose: No rhinorrhea.     Mouth/Throat:     Mouth: Mucous membranes are moist.     Pharynx: Oropharynx is clear. No posterior oropharyngeal erythema.  Eyes:     Conjunctiva/sclera: Conjunctivae normal.  Cardiovascular:     Rate and Rhythm: Normal rate and regular rhythm.     Pulses: Normal pulses.     Heart sounds: Normal heart sounds.  Pulmonary:     Effort: Pulmonary effort is normal.     Breath sounds: Normal breath sounds. No wheezing or rales.  Musculoskeletal:     Cervical back: Normal range of motion.  Lymphadenopathy:     Cervical: No cervical adenopathy.  Skin:    General: Skin is warm and dry.  Neurological:     Mental Status: She is alert and oriented to person, place, and time.     UC Treatments / Results  Labs (all labs ordered are listed, but only abnormal results are displayed) Labs Reviewed - No data to display  EKG   Radiology No results found.  Procedures Procedures (including critical care time)  Medications Ordered in UC Medications - No data to display  Initial Impression / Assessment and Plan / UC Course  I have reviewed the triage vital signs and the nursing notes.  Pertinent labs & imaging results that were available during my care of the patient were reviewed by me and considered in my medical decision making (see chart for details).  Clear lungs, afebrile in clinic Discussed viral etiology and symptomatic care Can try muscle relaxer for body aches, sent Tessalon for cough.  Advised other over-the-counter medicines and home remedies.  Discussed likely prognosis of virus, return if needed.  Work note was requested and provided  Final Clinical Impressions(s) / UC Diagnoses   Final diagnoses:  Viral URI with cough     Discharge Instructions      You can take the muscle relaxer Flexeril twice daily. If the medication makes you drowsy, take only at bed time.  The tessalon cough pills can be taken 3x daily. If this  medication makes you drowsy, take only one pill before bed.  Continue ibuprofen and tylenol Drink lots of fluids You may have 3-4 more days of symptoms before they improve     ED Prescriptions     Medication Sig Dispense Auth. Provider   cyclobenzaprine (FLEXERIL) 10 MG tablet Take 1 tablet (10 mg total) by mouth 2 (two) times daily as needed for muscle spasms. 20 tablet Babita Amaker, PA-C   benzonatate (TESSALON) 100 MG capsule Take 1 capsule (100 mg total) by mouth 3 (three) times daily as needed for cough. 30 capsule Verdell Kincannon, Lurena Joiner, PA-C      PDMP not reviewed this encounter.   Marlow Baars, [place] 10/04/23 1317

## 2023-10-04 NOTE — Discharge Instructions (Addendum)
You can take the muscle relaxer Flexeril twice daily. If the medication makes you drowsy, take only at bed time.  The tessalon cough pills can be taken 3x daily. If this medication makes you drowsy, take only one pill before bed.  Continue ibuprofen and tylenol Drink lots of fluids You may have 3-4 more days of symptoms before they improve

## 2023-10-11 ENCOUNTER — Telehealth: Payer: Self-pay | Admitting: Podiatry

## 2023-10-11 NOTE — Telephone Encounter (Signed)
Patient called regarding appointment from 08/17/2023 with Dr. Al Corpus. Patient stated she never received MRI results. Patient is requesting Provider or nurse to contact patient. [phone]

## 2023-10-21 ENCOUNTER — Ambulatory Visit: Payer: Medicaid Other | Admitting: Podiatry

## 2023-10-21 ENCOUNTER — Telehealth: Payer: Self-pay

## 2023-10-21 NOTE — Telephone Encounter (Signed)
 Called and spoke to patient - we can cancel appointment for today. I will send MRI for over read today with particular focus on the plantar fascia. thanks

## 2023-10-21 NOTE — Telephone Encounter (Signed)
-----   Message from Royden ONEIDA Cedar sent at 10/21/2023  7:12 AM EST ----- I would like for you to let her know that I want to send out for an over read.  This read did not address her problem area.  We can call her for the reschedule.    Also Powell would you please make sure this gets send for an over read looking at the Plantar fascia.????? Thank you.

## 2023-11-04 ENCOUNTER — Ambulatory Visit: Payer: Medicaid Other | Admitting: Podiatry

## 2023-11-04 ENCOUNTER — Encounter: Payer: Self-pay | Admitting: Podiatry

## 2023-11-04 DIAGNOSIS — S86312D Strain of muscle(s) and tendon(s) of peroneal muscle group at lower leg level, left leg, subsequent encounter: Secondary | ICD-10-CM

## 2023-11-04 DIAGNOSIS — S93692D Other sprain of left foot, subsequent encounter: Secondary | ICD-10-CM

## 2023-11-04 MED ORDER — CELECOXIB 200 MG PO CAPS: 200.0000 mg | ORAL_CAPSULE | Freq: Two times a day (BID) | ORAL | 2 refills | Status: DC

## 2023-11-04 MED ORDER — PREDNISONE 10 MG (21) PO TBPK: ORAL_TABLET | ORAL | 0 refills | Status: DC

## 2023-11-04 NOTE — Progress Notes (Signed)
 3

## 2023-11-06 NOTE — Progress Notes (Signed)
 She presents today and we have an interpreter available for her.  She states that her left foot is still hurting and now her right foot is starting to hurt right and here as she points to the anterior medial aspect of the right foot just at the level of the arch and the navicular tuberosity.  States this has been going on for the last few weeks.  States that the left foot hurts so bad by the end of the day today she usually ends up crying she states that she can hardly work and is not able to wear her cam boot to work.  She states that the cam boot makes her feel much better however she is unable to wear it due to the pain and stress to depends on her back.  Objective: Vital signs are stable oriented x 3 right foot demonstrates no erythema edema cellulitis drainage or odor though she does have tenderness on palpation at the navicular tuberosity and at the posterior tibial tendon just proximal to its insertion on the spine.  She also has tenderness on palpation of the tibialis anterior tendon as it inserts on the medial cuneiform and in the plantar surface area of that bone to.  MRI relates edema to the body of the talus a type II accessory navicular with marrow edema and this is well she also has fibrosis and edema of the sinus tarsi on that side.  She also has common peroneal tendon sheath and posterior tibial tendinitis..  Assessment compensatory changes to the right foot most likely affecting the posterior tibial tendon as well as the tibialis anterior tendon.  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  Plan: Placed her in a Tri-Lock brace start her on methylprednisolone/Sterapred Dosepak to be followed by Celebrex.  I did discuss this case with Dr. Lilian Kapur today and he states that he would take a look at her in the next time she comes in if she has not improved.  Orthotics were not an option for her because of her insurance.

## 2023-12-21 ENCOUNTER — Telehealth: Payer: Self-pay

## 2023-12-21 ENCOUNTER — Ambulatory Visit (INDEPENDENT_AMBULATORY_CARE_PROVIDER_SITE_OTHER)

## 2023-12-21 ENCOUNTER — Encounter: Payer: Self-pay | Admitting: Podiatry

## 2023-12-21 ENCOUNTER — Ambulatory Visit (INDEPENDENT_AMBULATORY_CARE_PROVIDER_SITE_OTHER): Payer: Medicaid Other | Admitting: Podiatry

## 2023-12-21 VITALS — Ht 63.0 in | Wt 294.0 lb

## 2023-12-21 DIAGNOSIS — M65979 Unspecified synovitis and tenosynovitis, unspecified ankle and foot: Secondary | ICD-10-CM

## 2023-12-21 DIAGNOSIS — M76822 Posterior tibial tendinitis, left leg: Secondary | ICD-10-CM

## 2023-12-21 DIAGNOSIS — S86312D Strain of muscle(s) and tendon(s) of peroneal muscle group at lower leg level, left leg, subsequent encounter: Secondary | ICD-10-CM | POA: Diagnosis not present

## 2023-12-21 DIAGNOSIS — M79672 Pain in left foot: Secondary | ICD-10-CM

## 2023-12-21 DIAGNOSIS — M2142 Flat foot [pes planus] (acquired), left foot: Secondary | ICD-10-CM

## 2023-12-21 DIAGNOSIS — M62462 Contracture of muscle, left lower leg: Secondary | ICD-10-CM

## 2023-12-21 MED ORDER — CELECOXIB 200 MG PO CAPS: 200.0000 mg | ORAL_CAPSULE | Freq: Two times a day (BID) | ORAL | 2 refills | Status: DC

## 2023-12-21 NOTE — Progress Notes (Signed)
 Subjective:  Patient ID: Darlene Cruz, female    DOB: 1985-11-05,  MRN: [id number]  Chief Complaint  Patient presents with   Foot Pain    Tear of peroneal tendon, left, subsequent encounter    Discussed the use of AI scribe software for clinical note transcription with the patient, who gave verbal consent to proceed.  History of Present Illness Darlene Cruz Mallory is a 38 year old female with pes planovagus deformity who presents with persistent foot pain.  She experiences persistent pain around her left ankle, with the most severe pain at the insertion of the navicular of the posterior tibial tendon. The pain is described as being 'all around' the ankle. No recent falls or injuries that could have initiated the pain.  She has previously received two injections that provided significant relief, reducing her pain to zero. Prednisone was prescribed for five days, resulting in dramatic relief, but the pain gradually returned after three weeks, though not to the previous intensity. Pain medication has been helpful in managing her symptoms.  She was referred to physical therapy but did not attend due to scheduling issues. Orthotics were prescribed but discontinued after two weeks as they worsened her pain.  She has been actively trying to lose weight and joined a gym during the period when her pain was reduced. However, the return of pain has limited her ability to exercise. She has not had any surgeries on her foot and does not smoke. She was prescribed Celebrex for pain management, which she continues to use. She is concerned about taking prednisone again due to stomach pain experienced during its use.      Objective:    Physical Exam VASCULAR: DP and PT pulse palpable. Foot is warm and well-perfused. Capillary fill time is brisk. DERMATOLOGIC: Normal skin turgor, texture, and temperature. No open lesions, rashes, or ulcerations. NEUROLOGIC: Normal sensation to light touch  and pressure. No paresthesias on examination. ORTHOPEDIC: Mild pain on palpation along peroneal tendons laterally and in sinus tarsi. Sharp pain at insertion of navicular of PT tendon. Pain with resisted inversion. Pain on palpation around the ankle and foot. No ecchymosis or bruising.    No images are attached to the encounter.    Results RADIOLOGY Left foot X-ray: Pes planovagus with forefoot abduction. Accessory navicular bone, slight calcaneal valgus on calcaneal axial view. (12/21/2023) Left foot and ankle MRI: Tailored bone marrow edema. Type two accessory navicular with marrow edema, marrow edema with accessory navicular syndrome, fibrosis and edema in the sinus tarsi. Tenosynovitis of peroneal tendons and PT tendon. Benign chondroid lesion in calcaneus, previous ATFL and CFL sprain. (09/06/2023)   Assessment:   1. Pain associated with accessory navicular bone of foot, left   2. Tear of peroneal tendon, left, subsequent encounter   3. Peroneal tenosynovitis   4. Posterior tibial tendon dysfunction (PTTD) of left lower extremity   5. Acquired pes planus, left   6. Gastrocnemius equinus of left lower extremity      Plan:  Patient was evaluated and treated and all questions answered.  Assessment and Plan Assessment & Plan Accessory Navicular Syndrome with Pes Planovalgus She presents with ankle pain at the navicular insertion of the posterior tibial tendon, with pes planovalgus deformity. Radiographs reveal an accessory navicular bone and slight calcaneal valgus. MRI shows marrow edema, sinus tarsi fibrosis, and tendinitis of the peroneal and posterior tibial tendons. Flat feet exacerbate inflammation and pain. Previous corticosteroid injections and oral prednisone provided temporary relief, but  she is concerned about prednisone's side effects, including stomach pain. Physical therapy was previously recommended but not initiated. Surgery, involving accessory navicular bone removal,  tendon repair, and joint fusion, was discussed today as a potential option if conservative treatments fail, with a recovery period of six to eight weeks non-weight bearing and up to six months for full recovery. - Order new x-rays to assess current condition - Refer to physical therapy to reduce inflammation and strengthen the arch.  Referral sent to [facility] in [place] - Recommend wearing supportive shoes, specifically Hoka or Altra brands, available at [employer] - Refill celecoxib prescription for pain management - Consider another round of prednisone if pain worsens, but she is hesitant due to side effects - Discuss potential surgical options if conservative treatments fail, including removal of the accessory navicular bone and tendon repair, noting the significant recovery time and complexity of the surgery  Weight Management Active weight loss is beneficial for reducing stress on the feet and managing pes planovalgus symptoms. Weight loss aids in symptom management but is not the primary cause of the condition. - Encourage continued weight loss efforts as it may help alleviate symptoms      Return in about 2 months (around 02/20/2024) for follow up after PT for flat foot pain.

## 2023-12-21 NOTE — Patient Instructions (Signed)
 VISIT SUMMARY:  Today, we discussed your persistent foot pain related to your pes planovagus deformity and accessory navicular syndrome. We reviewed your history, including previous treatments and their effects, and explored both conservative and surgical options for managing your condition.  YOUR PLAN:  -ACCESSORY NAVICULAR SYNDROME WITH PES PLANOVALGUS: This condition involves pain and inflammation in the foot due to an extra bone and flat feet. We will order new x-rays to assess your current condition and refer you to physical therapy to help reduce inflammation and strengthen your arch. You should wear supportive shoes, such as Hoka or Altra brands, which you can find at [employer]. We will refill your celecoxib prescription for pain management. If your pain worsens, we may consider another round of prednisone, although you are hesitant due to previous side effects. If conservative treatments fail, we discussed the possibility of surgery, which would involve removing the extra bone and repairing the tendon, with a significant recovery period.  -WEIGHT MANAGEMENT: Losing weight can help reduce the stress on your feet and manage your symptoms. While weight loss is not the primary cause of your condition, it can aid in symptom management. Continue your efforts to lose weight as it may help alleviate your symptoms.  INSTRUCTIONS:  Please schedule an appointment for new x-rays and start physical therapy as soon as possible. Continue wearing supportive shoes and taking your prescribed medication. If your pain worsens, contact us to discuss the possibility of another round of prednisone. Consider the surgical options we discussed if conservative treatments do not provide sufficient relief.

## 2023-12-21 NOTE — Telephone Encounter (Signed)
 Referral, office note, MRI result and demographics faxed to [facility] phone [phone], [phone]

## 2023-12-21 NOTE — Telephone Encounter (Signed)
-----   Message from Edwin Cap sent at 12/21/2023  1:05 PM EDT ----- Can you send this referral for PT to [facility] in [place]?  They are located [place] [address] near [address]

## 2024-02-22 ENCOUNTER — Ambulatory Visit: Admitting: Podiatry

## 2024-03-09 ENCOUNTER — Ambulatory Visit (INDEPENDENT_AMBULATORY_CARE_PROVIDER_SITE_OTHER): Admitting: Podiatry

## 2024-03-09 ENCOUNTER — Encounter: Payer: Self-pay | Admitting: Podiatry

## 2024-03-09 VITALS — Ht 63.0 in | Wt 294.0 lb

## 2024-03-09 DIAGNOSIS — M25572 Pain in left ankle and joints of left foot: Secondary | ICD-10-CM | POA: Diagnosis not present

## 2024-03-09 DIAGNOSIS — M7072 Other bursitis of hip, left hip: Secondary | ICD-10-CM

## 2024-03-09 NOTE — Patient Instructions (Signed)
 Your referral has been sent to [facility]

## 2024-03-13 NOTE — Progress Notes (Signed)
  Subjective:  Patient ID: Darlene Cruz, female    DOB: February 17, 1986,  MRN: [id number]  Chief Complaint  Patient presents with   Foot Pain    Pt is here to f/u on bilateral feet due being flat feet and having pain pt states she has completed 10 session of PT that went well pain is still there but not as bad as before.      Discussed the use of AI scribe software for clinical note transcription with the patient, who gave verbal consent to proceed.  History of Present Illness She returns for follow-up physical therapy has helped quite a bit she has less pain on the inside of the ankle is still pain on the outside of the ankle, she is also dealing with hip pain now, therapy worked on this some     Objective:    Physical Exam VASCULAR: DP and PT pulse palpable. Foot is warm and well-perfused. Capillary fill time is brisk. DERMATOLOGIC: Normal skin turgor, texture, and temperature. No open lesions, rashes, or ulcerations. NEUROLOGIC: Normal sensation to light touch and pressure. No paresthesias on examination. ORTHOPEDIC: No pain on the peroneal's or PT tendon today most pain is in the subtalar joint sinus tarsi   No images are attached to the encounter.    Results RADIOLOGY Left foot X-ray: Pes planovagus with forefoot abduction. Accessory navicular bone, slight calcaneal valgus on calcaneal axial view. (12/21/2023) Left foot and ankle MRI: Tailored bone marrow edema. Type two accessory navicular with marrow edema, marrow edema with accessory navicular syndrome, fibrosis and edema in the sinus tarsi. Tenosynovitis of peroneal tendons and PT tendon. Benign chondroid lesion in calcaneus, previous ATFL and CFL sprain. (09/06/2023)   Assessment:   Encounter Diagnoses  Name Primary?   Sinus tarsitis, left Yes   Bursitis of left hip, unspecified bursa       Plan:  Patient was evaluated and treated and all questions answered.  Assessment and Plan Assessment &  Plan Accessory Navicular Syndrome with Pes Planovalgus She returns for follow-up with some improvement with her medial pain.  Most of her pain is in the sinus tarsi now.  I recommended corticosteroid injection.  We discussed the risks and benefits.  Following consent sterile prep with Betadine  was completed and a subtalar joint injection of the sinus tarsi approach was completed with 20 mg Kenalog  4 mg dexamethasone  and 1 cc of 0.5% Marcaine  plain.  She tolerated it well and I will follow-up with her in about 2 months she will continue her home therapy plan as well.  Referral placed for evaluation of left hip by orthopedics  Return in about 2 months (around 05/09/2024) for ankle/foot pain f/u (new xrays if still painful).

## 2024-04-03 ENCOUNTER — Ambulatory Visit (INDEPENDENT_AMBULATORY_CARE_PROVIDER_SITE_OTHER): Admitting: Physician Assistant

## 2024-04-03 ENCOUNTER — Encounter: Payer: Self-pay | Admitting: Physician Assistant

## 2024-04-03 ENCOUNTER — Other Ambulatory Visit (INDEPENDENT_AMBULATORY_CARE_PROVIDER_SITE_OTHER): Payer: Self-pay

## 2024-04-03 DIAGNOSIS — M5442 Lumbago with sciatica, left side: Secondary | ICD-10-CM

## 2024-04-03 MED ORDER — GABAPENTIN 300 MG PO CAPS: 300.0000 mg | ORAL_CAPSULE | Freq: Every day | ORAL | 0 refills | Status: AC

## 2024-04-03 NOTE — Progress Notes (Signed)
 Office Visit Note   Patient: Darlene Cruz           Date of Birth: 18-Jul-1986           MRN: [id number] Visit Date: 04/03/2024              Requested by: Darlene Cruz, DPM [address] [place],  [place] [address] PCP: Darlene Lauraine NOVAK, PA-C   Assessment & Plan: Visit Diagnoses:  1. Acute left-sided low back pain with left-sided sciatica     Plan:  Given patient's significant low back pain which is getting worse and the failure of conservative treatment which included 2 months of therapy for both her foot and her back and hips.  Recommend MRI of the lumbar spine to rule out HNP as a source of her radiculopathy.  She will follow-up with us  after the MRI to go over results discuss further treatment.  Questions were encouraged and answered at length using interpreter.  Follow-Up Instructions: Return After MRI.   Orders:  Orders Placed This Encounter  Procedures   XR Lumbar Spine 2-3 Views   Meds ordered this encounter  Medications   gabapentin  (NEURONTIN ) 300 MG capsule    Sig: Take 1 capsule (300 mg total) by mouth at bedtime.    Dispense:  30 capsule    Refill:  0      Procedures: No procedures performed   Clinical Data: No additional findings.   Subjective: Chief Complaint  Patient presents with   Lower Back - Pain   Left Hip - Pain    HPI Darlene Cruz 38 year old female who speaks Arabic.  She is from [place].  Comes in today with low back pain but it has been ongoing for a long period of time but is became particularly worse over the last 2 months.  She is having pain and numbness down the lateral posterior aspect of her left femur.  She states this electrical shock at times.  Is worse with sitting.  Does not usually radiate past the knee.  She has had no injuries.  Denies any fevers bowel or bladder dysfunction.  She does note that the back pain and the pain down her leg awakens her.  She has had some 33 pounds of weight loss since this January as she has  been trying to lose weight.  She works as a Investment banker, operational.  She has tried ibuprofen  and Celebrex  with no real relief but did cause stomach upset.  Patient is prediabetic on metformin.  Reports that she is to undergo what sounds like total hysterectomy at [facility] in a few months.  She has gone to physical therapy for her feet back and hip.  States she was unable to tolerate physical therapy very well due to the increased pain down her left leg.  Review of Systems See HPI otherwise negative  Objective: Vital Signs: There were no vitals taken for this visit.  Physical Exam General: Well-developed well-nourished female in no acute distress.  Mood and affect appropriate.  Psych alert and oriented x 3. Vascular: Dorsal pedal pulses are 2+ and equal symmetric Lower extremities 5 out of 5 strength throughout against resistance except for left great toe extension is slightly weaker than the right great toe.  Sensation grossly intact bilateral feet.  Light touch throughout. Lumbar spine positive straight leg raise on the left.  Negative on the right.    Specialty Comments:  No specialty comments available.  Imaging: XR Lumbar Spine 2-3 Views  Result Date: 04/03/2024 Lumbar spine 2 views: No acute fractures.  No spondylolisthesis.  Disc base overall well-maintained.  Mild facet arthritis at L4-5 and L5-S1.  Normal lordotic curvature.    PMFS History: Patient Active Problem List   Diagnosis Date Noted   Fibroids 04/30/2022   Iron deficiency anemia due to chronic blood loss 04/30/2022   Irregular menstrual cycle 04/30/2022   Chronic bilateral low back pain with bilateral sciatica 01/27/2022   Pain in both feet 01/27/2022   Polyarthralgia 01/27/2022   Morbid obesity (HCC)    History of vitamin D deficiency 05/15/2019   Vitamin D deficiency 05/15/2019   Past Medical History:  Diagnosis Date   Pelvic pain in female 03/12/2016    Family History  Problem Relation Age of Onset   Hypertension Mother     Diabetes Mother    Hyperlipidemia Mother     Past Surgical History:  Procedure Laterality Date   CESAREAN SECTION  2011   approx   DILATION AND CURETTAGE OF UTERUS  2009   missed ab   KNEE SURGERY Left 2010   LAPAROSCOPIC OVARIAN CYSTECTOMY Bilateral 10/24/2021   Procedure: DIAGNOSTIC LAPROSCOPY/ PERITONEAL BIOPSIES;  Surgeon: Darlene Slough, MD;  Location: [facility];  Service: Gynecology;  Laterality: Bilateral;   TONSILLECTOMY AND ADENOIDECTOMY     age 32   Social History   Occupational History   Not on file  Tobacco Use   Smoking status: Former    Current packs/day: 0.20    Average packs/day: 0.2 packs/day for 5.0 years (1.0 ttl pk-yrs)    Types: Cigarettes   Smokeless tobacco: Never   Tobacco comments:    quit July 2016   Substance and Sexual Activity   Alcohol use: No    Alcohol/week: 0.0 standard drinks of alcohol   Drug use: No   Sexual activity: Yes    Birth control/protection: None

## 2024-04-04 ENCOUNTER — Other Ambulatory Visit: Payer: Self-pay | Admitting: Radiology

## 2024-04-04 DIAGNOSIS — M5442 Lumbago with sciatica, left side: Secondary | ICD-10-CM

## 2024-04-05 ENCOUNTER — Encounter: Payer: Self-pay | Admitting: Physician Assistant

## 2024-04-16 ENCOUNTER — Other Ambulatory Visit

## 2024-04-21 ENCOUNTER — Other Ambulatory Visit: Payer: Self-pay | Admitting: Podiatry

## 2024-05-09 ENCOUNTER — Ambulatory Visit: Admitting: Podiatry

## 2024-05-16 ENCOUNTER — Ambulatory Visit (INDEPENDENT_AMBULATORY_CARE_PROVIDER_SITE_OTHER)

## 2024-05-16 ENCOUNTER — Ambulatory Visit (INDEPENDENT_AMBULATORY_CARE_PROVIDER_SITE_OTHER): Admitting: Podiatry

## 2024-05-16 VITALS — Ht 63.0 in | Wt 294.0 lb

## 2024-05-16 DIAGNOSIS — M25572 Pain in left ankle and joints of left foot: Secondary | ICD-10-CM

## 2024-05-16 DIAGNOSIS — S86312D Strain of muscle(s) and tendon(s) of peroneal muscle group at lower leg level, left leg, subsequent encounter: Secondary | ICD-10-CM

## 2024-05-16 MED ORDER — MELOXICAM 15 MG PO TABS: 15.0000 mg | ORAL_TABLET | Freq: Every day | ORAL | 3 refills | Status: AC

## 2024-05-16 NOTE — Progress Notes (Signed)
  Subjective:  Patient ID: Darlene Cruz, female    DOB: 1986/03/16,  MRN: [id number]  Chief Complaint  Patient presents with   Foot Pain    RM 3 Patient is here as a follow-up on left ankle pain. Patient sates receiving a steriod shot at last appointment which lasted for 5 days. Patients states a sharp pain in ankle after prolonged standing and walking.      Discussed the use of AI scribe software for clinical note transcription with the patient, who gave verbal consent to proceed.  History of Present Illness She returns for follow-up on her left ankle the injection she had at last visit only helped for about 5 days     Objective:    Physical Exam VASCULAR: DP and PT pulse palpable. Foot is warm and well-perfused. Capillary fill time is brisk. DERMATOLOGIC: Normal skin turgor, texture, and temperature. No open lesions, rashes, or ulcerations. NEUROLOGIC: Normal sensation to light touch and pressure. No paresthesias on examination. ORTHOPEDIC: Today some tenderness over the peroneals no pain over the PT tendon medially, she has pain over the anterior ankle and sinus tarsi   No images are attached to the encounter.    Results RADIOLOGY Left foot X-ray: Pes planovagus with forefoot abduction. Accessory navicular bone, slight calcaneal valgus on calcaneal axial view. (12/21/2023) Left foot and ankle MRI: Tailored bone marrow edema. Type two accessory navicular with marrow edema, marrow edema with accessory navicular syndrome, fibrosis and edema in the sinus tarsi. Tenosynovitis of peroneal tendons and PT tendon. Benign chondroid lesion in calcaneus, previous ATFL and CFL sprain. (09/06/2023)   Assessment:   Encounter Diagnoses  Name Primary?   Sinus tarsitis, left Yes   Tear of peroneal tendon, left, subsequent encounter       Plan:  Patient was evaluated and treated and all questions answered.  Assessment and Plan Assessment & Plan Peroneal tenosynovitis,  possible peroneal tendon tear and sinus tarsitis.  We discussed the continued pain she has she had minimal improvement with injection therapy.  She has not taken the oral steroid as previously prescribed yet she is going to trial this as well as continuing meloxicam  as needed.  Refill of this was sent today.  I recommended a new MRI to reevaluate the soft tissues and joint surfaces of the lateral ankle, she has improvement medially.  I will see her back in the MRI in 6 weeks.  Continue home physical therapy for now.  Return in about 6 weeks (around 06/27/2024).

## 2024-05-16 NOTE — Patient Instructions (Signed)

## 2024-05-23 ENCOUNTER — Encounter: Payer: Self-pay | Admitting: Podiatry

## 2024-05-23 ENCOUNTER — Telehealth: Payer: Self-pay

## 2024-05-23 NOTE — Telephone Encounter (Signed)
 PA was denied because:     Patient must give written permission for appeal.

## 2024-05-27 ENCOUNTER — Inpatient Hospital Stay: Admission: RE | Admit: 2024-05-27 | Source: Ambulatory Visit

## 2024-06-27 ENCOUNTER — Ambulatory Visit: Admitting: Podiatry

## 2024-09-29 ENCOUNTER — Ambulatory Visit
Admission: EM | Admit: 2024-09-29 | Discharge: 2024-09-29 | Disposition: A | Discharge status: Home / Self Care | Attending: Family Medicine | Admitting: Family Medicine

## 2024-09-29 DIAGNOSIS — R52 Pain, unspecified: Secondary | ICD-10-CM

## 2024-09-29 DIAGNOSIS — J101 Influenza due to other identified influenza virus with other respiratory manifestations: Secondary | ICD-10-CM | POA: Diagnosis not present

## 2024-09-29 DIAGNOSIS — J309 Allergic rhinitis, unspecified: Secondary | ICD-10-CM | POA: Diagnosis not present

## 2024-09-29 LAB — POC COVID19/FLU A&B COMBO
Covid Antigen, POC: NEGATIVE
Influenza A Antigen, POC: NEGATIVE
Influenza B Antigen, POC: POSITIVE — AB

## 2024-09-29 MED ORDER — DEXAMETHASONE SOD PHOSPHATE PF 10 MG/ML IJ SOLN
10.0000 mg | Freq: Once | INTRAMUSCULAR | Status: AC
  Administered 2024-09-29: 10 mg via INTRAMUSCULAR

## 2024-09-29 MED ORDER — PSEUDOEPHEDRINE HCL 30 MG PO TABS: 30.0000 mg | ORAL_TABLET | Freq: Three times a day (TID) | ORAL | 0 refills | Status: AC | PRN

## 2024-09-29 MED ORDER — OSELTAMIVIR PHOSPHATE 75 MG PO CAPS: 75.0000 mg | ORAL_CAPSULE | Freq: Two times a day (BID) | ORAL | 0 refills | Status: AC

## 2024-09-29 MED ORDER — CETIRIZINE HCL 10 MG PO TABS: 10.0000 mg | ORAL_TABLET | Freq: Every day | ORAL | 0 refills | Status: AC

## 2024-09-29 NOTE — Discharge Instructions (Addendum)
 I am treating you for influenza B with Tamiflu .  You have a you have an injection of dexamethasone  in clinic today.  This is a steroid that can help you with your sinus headaches, sinus congestion, sinus inflammation.  I am also prescribing you Zyrtec  to take long-term to keep helping with your sinuses.  Pseudoephedrine  is a medication you can use now that you are sick and only as you need to in the future to help with sinus congestion and sinus drainage.    ?????? ?? ???????? ?? ????? B ???????? ???? ???????. ?????? ???? ???????????? ?? ??????? ?????. ??? ?????? ?? ????? ?? ??????? ???? ?? ?????? ?? ????? ?????? ?????? ?? ?????? ?????? ??????? ????????? ?????????. ??? ???? ?? ???? ?????? ??????? ??? ????? ?????? ???????? ?? ???? ????? ?????? ???????. ??? ???? ??????????????? ?????? ???????? ???? ????? ????? ???? ?????? ??? ?? ????????? ???????? ?? ????? ?????? ?????? ??????? ????????.

## 2024-09-29 NOTE — ED Triage Notes (Signed)
 Pt reports fever,cold symptoms, body aches and chills and headache x 3 days; sore throat x 1 day, vomited 1 time lats night. Ibuprofen  and Cold and flu meds gives some relief.

## 2024-09-29 NOTE — ED Provider Notes (Signed)
 " [facility], [facility] - [facility]  Note:  This document was prepared using Conservation officer, historic buildings and may include unintentional dictation errors.  MRN: [id number] DOB: [birth year]  Subjective:   Darlene Cruz is a 39 y.o. female presenting for 3 day history of fever, body aches, chills, sinus headaches, sinus congestion. Reports history of difficulty with her sinuses. No chest pain, shob, wheezing.  Patient requested aggressive management as she has difficulty tolerating medicines orally.  Therefore offered dexamethasone  IM.  Current Outpatient Medications  Medication Instructions   benzonatate  (TESSALON ) 100 mg, Oral, 3 times daily PRN   cetirizine  (ZYRTEC ) 20 mg, Oral, 2 times daily   Cholecalciferol (VITAMIN D PO) 1 tablet, Weekly   Cholecalciferol 1.25 MG (50000 UT) capsule Take by mouth.   diphenhydrAMINE  (BENADRYL ) 25 mg, Oral, Every 6 hours   famotidine  (PEPCID ) 20 mg, Oral, 2 times daily   ferrous sulfate 325 mg   gabapentin  (NEURONTIN ) 300 mg, Oral, Daily at bedtime   HAILEY FE 1/20 1-20 MG-MCG tablet 1 tablet, Daily   levocetirizine (XYZAL ) 10 mg, Oral, Every morning   meloxicam  (MOBIC ) 15 mg, Oral, Daily   metFORMIN (GLUCOPHAGE) 500 MG tablet    mirtazapine (REMERON) 15 mg, Daily at bedtime   misoprostol (CYTOTEC) 100 MCG tablet Place 1 tablet intravaginally the night before your scheduled procedure.   montelukast  (SINGULAIR ) 10 mg, Oral, Daily at bedtime   mupirocin  ointment (BACTROBAN ) 2 % 1 Application, Topical, 2 times daily   norethindrone (AYGESTIN) 5 mg   predniSONE  (STERAPRED UNI-PAK 21 TAB) 10 MG (21) TBPK tablet TAKE BY MOUTH AS INSTRUCTED - PER PACKAGE INSTRUCTIONS    Allergies[1]  Past Medical History:  Diagnosis Date   Pelvic pain in female 03/12/2016     Past Surgical History:  Procedure Laterality Date   CESAREAN SECTION  2011   approx   DILATION AND CURETTAGE OF UTERUS  2009   missed ab   KNEE SURGERY Left 2010    LAPAROSCOPIC OVARIAN CYSTECTOMY Bilateral 10/24/2021   Procedure: DIAGNOSTIC LAPROSCOPY/ PERITONEAL BIOPSIES;  Surgeon: Henry Slough, MD;  Location: [facility] [place];  Service: Gynecology;  Laterality: Bilateral;   TONSILLECTOMY AND ADENOIDECTOMY     age 16    Family History  Problem Relation Age of Onset   Hypertension Mother    Diabetes Mother    Hyperlipidemia Mother     Social History   Occupational History   Not on file  Tobacco Use   Smoking status: Former    Current packs/day: 0.20    Average packs/day: 0.2 packs/day for 5.0 years (1.0 ttl pk-yrs)    Types: Cigarettes   Smokeless tobacco: Never   Tobacco comments:    quit July 2016   Vaping Use   Vaping status: Never Used  Substance and Sexual Activity   Alcohol use: Never   Drug use: Never   Sexual activity: Yes    Birth control/protection: None     ROS   Objective:   Vitals: BP 130/80 (BP Location: Right Arm)   Temp 98.3 F (36.8 C) (Oral)   Resp 18   SpO2 96%   Physical Exam Constitutional:      General: She is not in acute distress.    Appearance: Normal appearance. She is well-developed and normal weight. She is not ill-appearing, toxic-appearing or diaphoretic.  HENT:     Head: Normocephalic and atraumatic.     Right Ear: Tympanic membrane, ear canal and external ear normal. No drainage  or tenderness. No middle ear effusion. There is no impacted cerumen. Tympanic membrane is not erythematous or bulging.     Left Ear: Tympanic membrane, ear canal and external ear normal. No drainage or tenderness.  No middle ear effusion. There is no impacted cerumen. Tympanic membrane is not erythematous or bulging.     Nose: Congestion present. No rhinorrhea.     Mouth/Throat:     Mouth: Mucous membranes are moist. No oral lesions.     Pharynx: No pharyngeal swelling, oropharyngeal exudate, posterior oropharyngeal erythema or uvula swelling.     Tonsils: No tonsillar exudate or tonsillar abscesses. 0  on the right. 0 on the left.  Eyes:     General: No scleral icterus.       Right eye: No discharge.        Left eye: No discharge.     Extraocular Movements: Extraocular movements intact.     Right eye: Normal extraocular motion.     Left eye: Normal extraocular motion.     Conjunctiva/sclera: Conjunctivae normal.  Cardiovascular:     Rate and Rhythm: Normal rate and regular rhythm.     Heart sounds: Normal heart sounds. No murmur heard.    No friction rub. No gallop.  Pulmonary:     Effort: Pulmonary effort is normal. No respiratory distress.     Breath sounds: No stridor. No wheezing, rhonchi or rales.  Chest:     Chest wall: No tenderness.  Musculoskeletal:     Cervical back: Normal range of motion and neck supple.  Lymphadenopathy:     Cervical: No cervical adenopathy.  Skin:    General: Skin is warm and dry.  Neurological:     General: No focal deficit present.     Mental Status: She is alert and oriented to person, place, and time.     Cranial Nerves: No cranial nerve deficit.     Motor: No weakness.     Coordination: Coordination normal.     Gait: Gait normal.  Psychiatric:        Mood and Affect: Mood normal.        Behavior: Behavior normal.    Results for orders placed or performed during the hospital encounter of 09/29/24 (from the past 24 hours)  POC Covid19/Flu A&B Antigen     Status: Abnormal   Collection Time: 09/29/24 11:38 AM  Result Value Ref Range   Influenza A Antigen, POC Negative Negative   Influenza B Antigen, POC Positive (A) Negative   Covid Antigen, POC Negative Negative    Assessment and Plan :   PDMP not reviewed this encounter.  1. Influenza B   2. Body aches   3. Allergic rhinitis, unspecified seasonality, unspecified trigger      Deferred imaging given clear pulmonary exam. Will cover for influenza with Tamiflu  given + results.  Use supportive care, rest, fluids, hydration, light meals, schedule Tylenol  and ibuprofen . Counseled  patient on potential for adverse effects with medications prescribed today, patient verbalized understanding. ER and return-to-clinic precautions discussed, patient verbalized understanding.     [1] No Known Allergies    Christopher Savannah, PA-C 09/29/24 1143  "
